# Patient Record
Sex: Female | Born: 1970 | Race: Black or African American | Hispanic: No | Marital: Married | Smoking: Former smoker
Health system: Southern US, Community
[De-identification: ages and names within clinical notes are randomized; demographics above are authoritative.]

## PROBLEM LIST (undated history)

## (undated) DIAGNOSIS — M549 Dorsalgia, unspecified: Secondary | ICD-10-CM

## (undated) DIAGNOSIS — K219 Gastro-esophageal reflux disease without esophagitis: Secondary | ICD-10-CM

## (undated) DIAGNOSIS — G4733 Obstructive sleep apnea (adult) (pediatric): Secondary | ICD-10-CM

## (undated) DIAGNOSIS — F419 Anxiety disorder, unspecified: Secondary | ICD-10-CM

## (undated) DIAGNOSIS — G932 Benign intracranial hypertension: Secondary | ICD-10-CM

## (undated) DIAGNOSIS — R5383 Other fatigue: Secondary | ICD-10-CM

## (undated) DIAGNOSIS — G5600 Carpal tunnel syndrome, unspecified upper limb: Secondary | ICD-10-CM

## (undated) DIAGNOSIS — G4710 Hypersomnia, unspecified: Secondary | ICD-10-CM

## (undated) DIAGNOSIS — E1365 Other specified diabetes mellitus with hyperglycemia: Secondary | ICD-10-CM

## (undated) DIAGNOSIS — E785 Hyperlipidemia, unspecified: Secondary | ICD-10-CM

## (undated) DIAGNOSIS — IMO0001 Reserved for inherently not codable concepts without codable children: Secondary | ICD-10-CM

## (undated) DIAGNOSIS — G43909 Migraine, unspecified, not intractable, without status migrainosus: Secondary | ICD-10-CM

## (undated) DIAGNOSIS — M542 Cervicalgia: Secondary | ICD-10-CM

## (undated) DIAGNOSIS — F329 Major depressive disorder, single episode, unspecified: Secondary | ICD-10-CM

## (undated) DIAGNOSIS — G500 Trigeminal neuralgia: Secondary | ICD-10-CM

## (undated) DIAGNOSIS — M797 Fibromyalgia: Secondary | ICD-10-CM

## (undated) DIAGNOSIS — R079 Chest pain, unspecified: Secondary | ICD-10-CM

## (undated) DIAGNOSIS — G8929 Other chronic pain: Secondary | ICD-10-CM

## (undated) DIAGNOSIS — I10 Essential (primary) hypertension: Secondary | ICD-10-CM

## (undated) DIAGNOSIS — R202 Paresthesia of skin: Secondary | ICD-10-CM

## (undated) HISTORY — DX: Hypersomnia, unspecified: G47.10

## (undated) HISTORY — DX: Dorsalgia, unspecified: M54.9

## (undated) HISTORY — DX: Reserved for inherently not codable concepts without codable children: IMO0001

## (undated) HISTORY — DX: Other fatigue: R53.83

## (undated) HISTORY — DX: Trigeminal neuralgia: G50.0

## (undated) HISTORY — DX: Fibromyalgia: M79.7

## (undated) HISTORY — PX: UPPER GI ENDOSCOPY: SHX6162

## (undated) HISTORY — DX: Migraine, unspecified, not intractable, without status migrainosus: G43.909

## (undated) HISTORY — DX: Morbid (severe) obesity due to excess calories: E66.01

## (undated) HISTORY — DX: Gastro-esophageal reflux disease without esophagitis: K21.9

## (undated) HISTORY — DX: Chest pain, unspecified: R07.9

## (undated) HISTORY — DX: Obstructive sleep apnea (adult) (pediatric): G47.33

## (undated) HISTORY — DX: Benign intracranial hypertension: G93.2

## (undated) HISTORY — PX: PARTIAL HYSTERECTOMY: SHX80

## (undated) HISTORY — DX: Hyperlipidemia, unspecified: E78.5

## (undated) HISTORY — DX: Paresthesia of skin: R20.2

## (undated) HISTORY — DX: Anxiety disorder, unspecified: F41.9

## (undated) HISTORY — DX: Carpal tunnel syndrome, unspecified upper limb: G56.00

## (undated) HISTORY — DX: Other chronic pain: G89.29

## (undated) HISTORY — DX: Other specified diabetes mellitus with hyperglycemia: E13.65

## (undated) HISTORY — DX: Cervicalgia: M54.2

## (undated) HISTORY — DX: Essential (primary) hypertension: I10

## (undated) HISTORY — DX: Major depressive disorder, single episode, unspecified: F32.9

---

## 2004-02-27 ENCOUNTER — Ambulatory Visit (HOSPITAL_COMMUNITY): Admission: RE | Admit: 2004-02-27 | Discharge: 2004-02-27 | Payer: Self-pay | Admitting: Internal Medicine

## 2004-02-27 ENCOUNTER — Ambulatory Visit: Payer: Self-pay | Admitting: Internal Medicine

## 2005-04-30 DIAGNOSIS — D649 Anemia, unspecified: Secondary | ICD-10-CM | POA: Insufficient documentation

## 2005-04-30 DIAGNOSIS — R5383 Other fatigue: Secondary | ICD-10-CM | POA: Insufficient documentation

## 2005-04-30 DIAGNOSIS — E876 Hypokalemia: Secondary | ICD-10-CM | POA: Insufficient documentation

## 2005-04-30 HISTORY — DX: Anemia, unspecified: D64.9

## 2005-04-30 HISTORY — DX: Hypokalemia: E87.6

## 2005-08-21 DIAGNOSIS — F411 Generalized anxiety disorder: Secondary | ICD-10-CM | POA: Insufficient documentation

## 2005-08-21 DIAGNOSIS — F329 Major depressive disorder, single episode, unspecified: Secondary | ICD-10-CM | POA: Insufficient documentation

## 2005-08-21 HISTORY — DX: Major depressive disorder, single episode, unspecified: F32.9

## 2005-08-21 HISTORY — DX: Generalized anxiety disorder: F41.1

## 2006-07-30 DIAGNOSIS — R928 Other abnormal and inconclusive findings on diagnostic imaging of breast: Secondary | ICD-10-CM

## 2006-07-30 HISTORY — DX: Other abnormal and inconclusive findings on diagnostic imaging of breast: R92.8

## 2006-12-17 DIAGNOSIS — S8000XA Contusion of unspecified knee, initial encounter: Secondary | ICD-10-CM

## 2006-12-17 HISTORY — DX: Contusion of unspecified knee, initial encounter: S80.00XA

## 2008-07-12 DIAGNOSIS — L909 Atrophic disorder of skin, unspecified: Secondary | ICD-10-CM

## 2008-07-12 HISTORY — DX: Atrophic disorder of skin, unspecified: L90.9

## 2009-05-20 ENCOUNTER — Ambulatory Visit: Payer: Self-pay | Admitting: Cardiology

## 2015-05-09 ENCOUNTER — Encounter: Payer: Self-pay | Admitting: *Deleted

## 2015-05-10 ENCOUNTER — Ambulatory Visit (INDEPENDENT_AMBULATORY_CARE_PROVIDER_SITE_OTHER): Payer: Managed Care, Other (non HMO) | Admitting: Neurology

## 2015-05-10 ENCOUNTER — Encounter: Payer: Self-pay | Admitting: Neurology

## 2015-05-10 VITALS — BP 130/84 | HR 76 | Ht 65.0 in | Wt 298.4 lb

## 2015-05-10 DIAGNOSIS — G43709 Chronic migraine without aura, not intractable, without status migrainosus: Secondary | ICD-10-CM | POA: Diagnosis not present

## 2015-05-10 DIAGNOSIS — G501 Atypical facial pain: Secondary | ICD-10-CM | POA: Diagnosis not present

## 2015-05-10 DIAGNOSIS — IMO0002 Reserved for concepts with insufficient information to code with codable children: Secondary | ICD-10-CM

## 2015-05-10 MED ORDER — CARBAMAZEPINE ER 100 MG PO TB12: 100.0000 mg | ORAL_TABLET | Freq: Two times a day (BID) | ORAL | Status: DC

## 2015-05-10 MED ORDER — ELETRIPTAN HYDROBROMIDE 20 MG PO TABS: 20.0000 mg | ORAL_TABLET | ORAL | Status: DC | PRN

## 2015-05-10 NOTE — Patient Instructions (Addendum)
1.  Increase carbamazepine to 100mg  twice daily 2.  If no improvement, start taking magnesium oxide 400-600mg  daily 3.  For severe headache, take Relpax 20mg  at headache onset.  Okay to repeat in 2 hours as needed  Return to clinic in 3 months

## 2015-05-10 NOTE — Progress Notes (Signed)
Note routed

## 2015-05-10 NOTE — Progress Notes (Signed)
[facility] Clinic Note - Initial Visit   Date: [id number]  JENNIGER LIGGON MRN: [id number] DOB: [birth year]   Dear Dr. Manuella Ghazi:  Thank you for your kind referral of Lenora Boys for consultation of facial pain. Although her history is well known to you, please allow [place] to reiterate it for the purpose of our medical record. The patient was accompanied to the clinic by husband who also provides collateral information.     History of Present Illness: Crystal Merritt is a 45 y.o. right-handed African [place] female with hypertension, migraines, fibromyalgia, diabetes mellitus, GERD, depression and anxiety presenting for evaluation of facial pain.    Since around ~2010, she began having pressure over the right cheek which was initially treated for sinusitis.  Symptoms would come and go, but for the past year, the pain become more constant.  Pain is always located on the over the right cheek, described as a tooth ache. She does not have electrical shock-like, stabbing, or shooting pain.    She also has long history of migraines since being a teenager.  She reports having daily headaches previously and lasting all day.  Triggers include stress, bright lights, and smell of smoke.  She has tried imitrex and topiramate without any relief.  She was started on carbmazepine 100mg  daily two weeks ago and has noticed 25% improvement.  Since taking carbmazpeine, headaches now occur about twice per week and are less intense.  Her facial pain has also improved.  Out-side paper records, electronic medical record, and images have been reviewed where available and summarized as:  Labs 02/01/2015:  HbA1c 6.9 Labs 09/22/2014:  TSH 2.030, vitamin B12 589  Past Medical History  Diagnosis Date  . Hypertension   . Hypersomnia   . Migraine   . Trigeminal neuralgia   . Fibromyalgia   . Morbid obesity ([place])   . Backache   . Depressive disorder   . GERD (gastroesophageal reflux  disease)   . Chronic neck pain   . Paresthesia of arm   . Chest pain   . MODY 1, uncomplicated, uncontrolled ([place])   . CTS (carpal tunnel syndrome)   . Fatigue   . Anxiety   . Hyperlipidemia     Past Surgical History  Procedure Laterality Date  . Partial hysterectomy       Medications:  Outpatient Encounter Prescriptions as of 05/10/2015  Medication Sig  . amLODipine (NORVASC) 10 MG tablet Take 10 mg by mouth daily.  Marland Kitchen aspirin 81 MG tablet Take 81 mg by mouth daily.  Marland Kitchen atorvastatin (LIPITOR) 40 MG tablet Take 40 mg by mouth daily.  . Biotin 5000 MCG CAPS Take by mouth.  . carbamazepine (TEGRETOL XR) 100 MG 12 hr tablet Take 1 tablet (100 mg total) by mouth 2 (two) times daily.  . clonazePAM (KLONOPIN) 0.5 MG tablet Take 0.5 mg by mouth 2 (two) times daily as needed for anxiety.  . DULoxetine (CYMBALTA) 60 MG capsule Take 60 mg by mouth daily.  Marland Kitchen losartan-hydrochlorothiazide (HYZAAR) 100-25 MG tablet Take 1 tablet by mouth daily.  . metFORMIN (GLUCOPHAGE) 500 MG tablet Take by mouth 2 (two) times daily with a meal. 2 in the am and 1 in the evening  . methocarbamol (ROBAXIN) 500 MG tablet Take 500 mg by mouth 4 (four) times daily.  . ondansetron (ZOFRAN) 8 MG tablet Take by mouth every 8 (eight) hours as needed for nausea or vomiting.  . pantoprazole (PROTONIX) 40 MG tablet Take 40  mg by mouth daily.  . pioglitazone (ACTOS) 30 MG tablet Take 30 mg by mouth daily.  . pregabalin (LYRICA) 150 MG capsule Take 150 mg by mouth 2 (two) times daily.  . promethazine (PHENERGAN) 25 MG tablet Take 25 mg by mouth every 6 (six) hours as needed for nausea or vomiting.  . [DISCONTINUED] carbamazepine (TEGRETOL XR) 100 MG 12 hr tablet Take 100 mg by mouth 2 (two) times daily.  Marland Kitchen eletriptan (RELPAX) 20 MG tablet Take 1 tablet (20 mg total) by mouth as needed for migraine or headache. May repeat in 2 hours if headache persists or recurs.  . [DISCONTINUED] topiramate (TOPAMAX) 100 MG tablet Take 100  mg by mouth 2 (two) times daily.   No facility-administered encounter medications on file as of 05/10/2015.     Allergies: No Known Allergies  Family History: Family History  Problem Relation Age of Onset  . Hypertension Mother     Deceased  . Heart disease Brother   . Diabetes Mother   . Diabetes Father     Deceased  . Diabetes Brother   . Kidney failure Brother     Deceased    Social History: Social History  Substance Use Topics  . Smoking status: Former Research scientist (life sciences)  . Smokeless tobacco: Never Used  . Alcohol Use: 0.0 oz/week    0 Standard drinks or equivalent per week     Comment: Rare   Social History   Social History Narrative   Lives with husband in a 2 story home.  Has 2 children.     Works in a call center Development worker, international aid)    Education: 2 year college degree.    Review of Systems:  CONSTITUTIONAL: No fevers, chills, night sweats, or weight loss.   EYES: No visual changes or eye pain ENT: No hearing changes.  No history of nose bleeds.   RESPIRATORY: No cough, wheezing and shortness of breath.   CARDIOVASCULAR: Negative for chest pain, and palpitations.   GI: Negative for abdominal discomfort, blood in stools or black stools.  No recent change in bowel habits.   GU:  No history of incontinence.   MUSCLOSKELETAL: No history of joint pain or swelling.  No myalgias.   SKIN: Negative for lesions, rash, and itching.   HEMATOLOGY/ONCOLOGY: Negative for prolonged bleeding, bruising easily, and swollen nodes.  No history of cancer.   ENDOCRINE: Negative for cold or heat intolerance, polydipsia or goiter.   PSYCH:  +depression or anxiety symptoms.   NEURO: As Above.   Vital Signs:  BP 130/84 mmHg  Pulse 76  Ht 5\' 5"  (1.651 m)  Wt 298 lb 6 oz (135.342 kg)  BMI 49.65 kg/m2  SpO2 98%   General Medical Exam:   General:  Well appearing, comfortable, morbidly obese.   Eyes/ENT: see cranial nerve examination.   Neck: No masses appreciated.  Full range of motion  without tenderness.  No carotid bruits. Respiratory:  Clear to auscultation, good air entry bilaterally.   Cardiac:  Regular rate and rhythm, no murmur.   Extremities:  No deformities, edema, or skin discoloration.  Skin:  No rashes or lesions.  Neurological Exam: MENTAL STATUS including orientation to time, place, person, recent and remote memory, attention span and concentration, language, and fund of knowledge is normal.  Speech is not dysarthric.  CRANIAL NERVES: II:  No visual field defects.  Unremarkable fundi.   III-IV-VI: Pupils equal round and reactive to light.  Normal conjugate, extra-ocular eye movements in all directions  of gaze.  No nystagmus.  No ptosis.   V:  Normal facial sensation.     VII:  Normal facial symmetry and movements.  VIII:  Normal hearing and vestibular function.   IX-X:  Normal palatal movement.   XI:  Normal shoulder shrug and head rotation.   XII:  Normal tongue strength and range of motion, no deviation or fasciculation.  MOTOR:  No atrophy, fasciculations or abnormal movements.  No pronator drift.  Tone is normal.    Right Upper Extremity:    Left Upper Extremity:    Deltoid  5/5   Deltoid  5/5   Biceps  5/5   Biceps  5/5   Triceps  5/5   Triceps  5/5   Wrist extensors  5/5   Wrist extensors  5/5   Wrist flexors  5/5   Wrist flexors  5/5   Finger extensors  5/5   Finger extensors  5/5   Finger flexors  5/5   Finger flexors  5/5   Dorsal interossei  5/5   Dorsal interossei  5/5   Abductor pollicis  5/5   Abductor pollicis  5/5   Tone (Ashworth scale)  0  Tone (Ashworth scale)  0   Right Lower Extremity:    Left Lower Extremity:    Hip flexors  5/5   Hip flexors  5/5   Hip extensors  5/5   Hip extensors  5/5   Knee flexors  5/5   Knee flexors  5/5   Knee extensors  5/5   Knee extensors  5/5   Dorsiflexors  5/5   Dorsiflexors  5/5   Plantarflexors  5/5   Plantarflexors  5/5   Toe extensors  5/5   Toe extensors  5/5   Toe flexors  5/5   Toe  flexors  5/5   Tone (Ashworth scale)  0  Tone (Ashworth scale)  0   MSRs:  Right                                                                 Left brachioradialis 2+  brachioradialis 2+  biceps 2+  biceps 2+  triceps 2+  triceps 2+  patellar 2+  patellar 2+  ankle jerk 2+  ankle jerk 2+  Hoffman no  Hoffman no  plantar response down  plantar response down   SENSORY:  Normal and symmetric perception of light touch, pinprick, vibration, and proprioception.  Romberg's sign absent.   COORDINATION/GAIT: Normal finger-to- nose-finger and heel-to-shin.  Intact rapid alternating movements bilaterally.  Able to rise from a chair without using arms.  Gait narrow based and stable.     IMPRESSION: 1.  Atypical facial pain  - History and exam are not suggestive of trigeminal neuralgia, but fortunately she is having favorable response to carbamazepine, so I will further optimize this to 100mg  twice daily.  Side effects discussed.  She was informed to stop the medication if there is any rash.  2.  Chronic migraine  - Previously tried topiramate and imitrex  - She is already taking Lyrica and Cymbalta for fibromyalgia  - Improved on carbamazepine 100mg , hopefully will be able to reduce headaches even more with optimizing medication to twice daily  - She may try magnesium  oxide 400-600mg  daily also   - For acute treatment, take relpax 20mg  at headache onset  - Limit all rescue medications to twice per week  Return to clinic in 3 months.   The duration of this appointment visit was 45 minutes of face-to-face time with the patient.  Greater than 50% of this time was spent in counseling, explanation of diagnosis, planning of further management, and coordination of care.   Thank you for allowing me to participate in patient's care.  If I can answer any additional questions, I would be pleased to do so.    Sincerely,    Josephmichael Lisenbee K. Posey Pronto, DO

## 2015-08-22 ENCOUNTER — Ambulatory Visit (INDEPENDENT_AMBULATORY_CARE_PROVIDER_SITE_OTHER): Payer: Managed Care, Other (non HMO) | Admitting: Neurology

## 2015-08-22 ENCOUNTER — Ambulatory Visit: Payer: Managed Care, Other (non HMO) | Admitting: Neurology

## 2015-08-22 ENCOUNTER — Encounter: Payer: Self-pay | Admitting: Neurology

## 2015-08-22 VITALS — BP 100/80 | HR 72 | Ht 65.0 in | Wt 288.1 lb

## 2015-08-22 DIAGNOSIS — G501 Atypical facial pain: Secondary | ICD-10-CM

## 2015-08-22 DIAGNOSIS — G43909 Migraine, unspecified, not intractable, without status migrainosus: Secondary | ICD-10-CM

## 2015-08-22 DIAGNOSIS — G43709 Chronic migraine without aura, not intractable, without status migrainosus: Secondary | ICD-10-CM | POA: Diagnosis not present

## 2015-08-22 DIAGNOSIS — IMO0002 Reserved for concepts with insufficient information to code with codable children: Secondary | ICD-10-CM

## 2015-08-22 HISTORY — DX: Atypical facial pain: G50.1

## 2015-08-22 HISTORY — DX: Migraine, unspecified, not intractable, without status migrainosus: G43.909

## 2015-08-22 NOTE — Patient Instructions (Signed)
Great work on your [employer] loss!  Keep it up Start a walking exercise program  Return to clinic in 9 month

## 2015-08-22 NOTE — Progress Notes (Signed)
Follow-up Visit   Date: [id number]    Crystal Merritt MRN: [id number] DOB: [birth year]   Interim History: Crystal Merritt is a 45 y.o. 45 y.o. right-handed African [place] female with hypertension, migraines, fibromyalgia, diabetes mellitus, GERD, depression and anxiety  returning to the clinic for follow-up of atypical right facial pain.   History of present illness: Since around ~2010, she began having pressure over the right cheek which was initially treated for sinusitis.  Symptoms would come and go, but for the past year, the pain become more constant.  Pain is always located on the over the right cheek, described as a tooth ache. She does not have electrical shock-like, stabbing, or shooting pain.    She also has long history of migraines since being a teenager.  She reports having daily headaches previously and lasting all day. Triggers include stress, bright lights, and smell of smoke.  She has tried imitrex and topiramate without any relief.  She was started on carbmazepine 100mg  daily two weeks ago and has noticed 25% improvement.  Since taking carbmazpeine, headaches now occur about twice per week and are less intense.  Her facial pain has also improved.  UPDATE 08/22/2015:  She reports having resolution of facial pain with increasing her carbamazepine to 100mg  twice daily. She is tolerating the medication well.  Her headaches are also markedly improved, now occurring only per month and respond to Excedrin.   She is actively trying to loose weight and has lost 13lb since her last visit by making better diet choices.   Medications:  Current Outpatient Prescriptions on File Prior to Visit  Medication Sig Dispense Refill  . amLODipine (NORVASC) 10 MG tablet Take 5 mg by mouth daily.     Marland Kitchen aspirin 81 MG tablet Take 81 mg by mouth daily.    Marland Kitchen atorvastatin (LIPITOR) 40 MG tablet Take 40 mg by mouth daily.    . Biotin 5000 MCG CAPS Take by mouth.    . carbamazepine (TEGRETOL XR)  100 MG 12 hr tablet Take 1 tablet (100 mg total) by mouth 2 (two) times daily. 180 tablet 3  . clonazePAM (KLONOPIN) 0.5 MG tablet Take 0.5 mg by mouth 2 (two) times daily as needed for anxiety.    . DULoxetine (CYMBALTA) 60 MG capsule Take 60 mg by mouth daily.    Marland Kitchen eletriptan (RELPAX) 20 MG tablet Take 1 tablet (20 mg total) by mouth as needed for migraine or headache. May repeat in 2 hours if headache persists or recurs. 10 tablet 2  . losartan-hydrochlorothiazide (HYZAAR) 100-25 MG tablet Take 1 tablet by mouth daily.    . metFORMIN (GLUCOPHAGE) 500 MG tablet Take by mouth 2 (two) times daily with a meal. 2 in the am and 1 in the evening    . methocarbamol (ROBAXIN) 500 MG tablet Take 500 mg by mouth 4 (four) times daily.    . ondansetron (ZOFRAN) 8 MG tablet Take by mouth every 8 (eight) hours as needed for nausea or vomiting.    . pantoprazole (PROTONIX) 40 MG tablet Take 40 mg by mouth daily.    . pregabalin (LYRICA) 150 MG capsule Take 150 mg by mouth 2 (two) times daily.    . promethazine (PHENERGAN) 25 MG tablet Take 25 mg by mouth every 6 (six) hours as needed for nausea or vomiting.     No current facility-administered medications on file prior to visit.     Allergies: No Known Allergies  Review of Systems:  CONSTITUTIONAL: No fevers, chills, night sweats, or weight loss.  EYES: No visual changes or eye pain ENT: No hearing changes.  No history of nose bleeds.   RESPIRATORY: No cough, wheezing and shortness of breath.   CARDIOVASCULAR: Negative for chest pain, and palpitations.   GI: Negative for abdominal discomfort, blood in stools or black stools.  No recent change in bowel habits.   GU:  No history of incontinence.   MUSCLOSKELETAL: No history of joint pain or swelling.  No myalgias.   SKIN: Negative for lesions, rash, and itching.   ENDOCRINE: Negative for cold or heat intolerance, polydipsia or goiter.   PSYCH:  No depression or anxiety symptoms.   NEURO: As  Above.   Vital Signs:  BP 100/80   Pulse 72   Ht 5\' 5"  (1.651 m)   Wt 288 lb 2 oz (130.7 kg)   SpO2 94%   BMI 47.95 kg/m   Neurological Exam: MENTAL STATUS including orientation to time, place, person, recent and remote memory, attention span and concentration, language, and fund of knowledge is normal.  Speech is not dysarthric.  CRANIAL NERVES:  Pupils equal round and reactive to light.  Normal conjugate, extra-ocular eye movements in all directions of gaze.  No ptosis. Normal facial sensation.  Face is symmetric. Palate elevates symmetrically.  Tongue is midline.  MOTOR:  Motor strength is 5/5 in all extremities.    COORDINATION/GAIT:   Gait narrow based and stable.   Data: Labs 02/01/2015:  HbA1c 6.9 Labs 09/22/2014:  TSH 2.030, vitamin B12 589  IMPRESSION/PLAN: 1.  Atypical facial pain - resolved with carbamazepine 100mg  twice daily  2.  Chronic migraine, improved!  Now only having headaches once per month which responsive to Excedrin  - Previously tried topiramate and imitrex and already taking Lyrica and Cymbalta for fibromyalgia  - Continue carbamazepine 100mg  BID, which has markedly improved headache frequency and intensity  3.  Morbid obesity  - Praised for loosing 13lb with diet changes  - Encouraged her to start exercise program with at least 10-min walking daily and gradually build up   Return to clinic in 9 months  The duration of this appointment visit was 25 minutes of face-to-face time with the patient.  Greater than 50% of this time was spent in counseling, explanation of diagnosis, planning of further management, and coordination of care.   Thank you for allowing me to participate in patient's care.  If I can answer any additional questions, I would be pleased to do so.    Sincerely,    Teniqua Marron K. Posey Pronto, DO

## 2016-03-13 ENCOUNTER — Encounter: Payer: Self-pay | Admitting: Neurology

## 2016-03-13 ENCOUNTER — Ambulatory Visit (INDEPENDENT_AMBULATORY_CARE_PROVIDER_SITE_OTHER): Payer: Managed Care, Other (non HMO) | Admitting: Neurology

## 2016-03-13 ENCOUNTER — Other Ambulatory Visit (INDEPENDENT_AMBULATORY_CARE_PROVIDER_SITE_OTHER): Payer: Managed Care, Other (non HMO)

## 2016-03-13 VITALS — BP 120/80 | HR 76 | Ht 65.0 in | Wt 262.2 lb

## 2016-03-13 DIAGNOSIS — IMO0002 Reserved for concepts with insufficient information to code with codable children: Secondary | ICD-10-CM

## 2016-03-13 DIAGNOSIS — R51 Headache: Secondary | ICD-10-CM | POA: Diagnosis not present

## 2016-03-13 DIAGNOSIS — G43709 Chronic migraine without aura, not intractable, without status migrainosus: Secondary | ICD-10-CM | POA: Diagnosis not present

## 2016-03-13 DIAGNOSIS — R200 Anesthesia of skin: Secondary | ICD-10-CM | POA: Diagnosis not present

## 2016-03-13 DIAGNOSIS — R519 Headache, unspecified: Secondary | ICD-10-CM

## 2016-03-13 DIAGNOSIS — R202 Paresthesia of skin: Secondary | ICD-10-CM

## 2016-03-13 LAB — TSH: TSH: 2.29 u[IU]/mL (ref 0.35–4.50)

## 2016-03-13 LAB — BASIC METABOLIC PANEL
BUN: 12 mg/dL (ref 6–23)
CHLORIDE: 99 meq/L (ref 96–112)
CO2: 33 meq/L — AB (ref 19–32)
CREATININE: 0.75 mg/dL (ref 0.40–1.20)
Calcium: 9 mg/dL (ref 8.4–10.5)
GFR: 107.11 mL/min (ref >60.00)
Glucose, Bld: 138 mg/dL — ABNORMAL HIGH (ref 70–99)
POTASSIUM: 3 meq/L — AB (ref 3.5–5.1)
Sodium: 139 mEq/L (ref 135–145)

## 2016-03-13 LAB — VITAMIN B12: VITAMIN B 12: 404 pg/mL (ref 211–911)

## 2016-03-13 MED ORDER — CARBAMAZEPINE ER 100 MG PO TB12: 100.0000 mg | ORAL_TABLET | Freq: Two times a day (BID) | ORAL | 3 refills | Status: DC

## 2016-03-13 NOTE — Patient Instructions (Addendum)
1.  MRI/A brain wwo contrast 2.  Increase carbamazepine to 100mg  twice daily 3.  Check labs  Return to clinic in 6 months

## 2016-03-13 NOTE — Progress Notes (Signed)
Follow-up Visit   Date: [id number]    Crystal Merritt MRN: [id number] DOB: Feb 26, 1970   Interim History: Crystal Merritt is a 46 y.o. 46 y.o. right-handed African [place] female with hypertension, migraines, fibromyalgia, diabetes mellitus, GERD, depression and anxiety returning to the clinic for follow-up of headaches.    History of present illness: Since around ~2010, she began having pressure over the right cheek which was initially treated for sinusitis.  Symptoms would come and go, but for the past year, the pain become more constant.  Pain is always located on the over the right cheek, described as a tooth ache. She does not have electrical shock-like, stabbing, or shooting pain.    She also has long history of migraines since being a teenager.  She reports having daily headaches previously and lasting all day. Triggers include stress, bright lights, and smell of smoke.  She has tried imitrex and topiramate without any relief.  She was started on carbmazepine 100mg  daily two weeks ago and has noticed 25% improvement.  Since taking carbmazpeine, headaches now occur about twice per week and are less intense.  Her facial pain has also improved.  UPDATE 08/22/2015:  She reports having resolution of facial pain with increasing her carbamazepine to 100mg  twice daily. She is tolerating the medication well.  Her headaches are also markedly improved, now occurring only per month and respond to Excedrin.   She is actively trying to loose weight and has lost 13lb since her last visit by making better diet choices.   UPDATE 03/13/2016:  She self tapered her carbamazepine to 100mg  daily soon after her last visit because her pain was doing well.  However, starting around late 2017, she started having worsening nagging pain over the right side of her face and her headaches returned.  Pain is sharp in her face and stabbing the head.  Pain lasts all day.  She has nausea, photophobia, and phonophobia.   She also feels that she is drooling on the right side of her mouth.  She has tried excedrin migraine, which helps for a few hours only.  She denies having any adverse effects to carbamazepine.  Medications:  Current Outpatient Prescriptions on File Prior to Visit  Medication Sig Dispense Refill  . amLODipine (NORVASC) 10 MG tablet Take 5 mg by mouth daily.     Marland Kitchen aspirin 81 MG tablet Take 81 mg by mouth daily.    Marland Kitchen atorvastatin (LIPITOR) 40 MG tablet Take 40 mg by mouth daily.    . Biotin 5000 MCG CAPS Take by mouth.    . canagliflozin (INVOKANA) 100 MG TABS tablet Take 100 mg by mouth daily before breakfast.    . carbamazepine (TEGRETOL XR) 100 MG 12 hr tablet Take 1 tablet (100 mg total) by mouth 2 (two) times daily. 180 tablet 3  . clonazePAM (KLONOPIN) 0.5 MG tablet Take 0.5 mg by mouth 2 (two) times daily as needed for anxiety.    . DULoxetine (CYMBALTA) 60 MG capsule Take 60 mg by mouth daily.    Marland Kitchen eletriptan (RELPAX) 20 MG tablet Take 1 tablet (20 mg total) by mouth as needed for migraine or headache. May repeat in 2 hours if headache persists or recurs. 10 tablet 2  . losartan-hydrochlorothiazide (HYZAAR) 100-25 MG tablet Take 1 tablet by mouth daily.    . metFORMIN (GLUCOPHAGE) 500 MG tablet Take by mouth 2 (two) times daily with a meal. 2 in the am and 1 in the evening    .  methocarbamol (ROBAXIN) 500 MG tablet Take 500 mg by mouth 4 (four) times daily.    . ondansetron (ZOFRAN) 8 MG tablet Take by mouth every 8 (eight) hours as needed for nausea or vomiting.    . pantoprazole (PROTONIX) 40 MG tablet Take 40 mg by mouth daily.    . pregabalin (LYRICA) 150 MG capsule Take 150 mg by mouth 2 (two) times daily.    . promethazine (PHENERGAN) 25 MG tablet Take 25 mg by mouth every 6 (six) hours as needed for nausea or vomiting.     No current facility-administered medications on file prior to visit.     Allergies: No Known Allergies  Review of Systems:  CONSTITUTIONAL: No fevers,  chills, night sweats, or weight loss.  EYES: No visual changes or eye pain ENT: No hearing changes.  No history of nose bleeds.   RESPIRATORY: No cough, wheezing and shortness of breath.   CARDIOVASCULAR: Negative for chest pain, and palpitations.   GI: Negative for abdominal discomfort, blood in stools or black stools.  No recent change in bowel habits.   GU:  No history of incontinence.   MUSCLOSKELETAL: No history of joint pain or swelling.  No myalgias.   SKIN: Negative for lesions, rash, and itching.   ENDOCRINE: Negative for cold or heat intolerance, polydipsia or goiter.   PSYCH:  No depression or anxiety symptoms.   NEURO: As Above.   Vital Signs:  BP 120/80   Pulse 76   Ht 5\' 5"  (1.651 m)   Wt 262 lb 4 oz (119 kg)   SpO2 98%   BMI 43.64 kg/m   General Medical Exam:   General:  Well appearing, comfortable, morbidly obese.   Eyes/ENT: see cranial nerve examination.   Neck: No masses appreciated.  Full range of motion without tenderness.  No carotid bruits. Respiratory:  Clear to auscultation, good air entry bilaterally.   Cardiac:  Regular rate and rhythm, no murmur.    Neurological Exam: MENTAL STATUS including orientation to time, place, person, recent and remote memory, attention span and concentration, language, and fund of knowledge is normal.  Speech is not dysarthric.  CRANIAL NERVES:  Normal fundoscopic exam. Pupils equal round and reactive to light.  Normal conjugate, extra-ocular eye movements in all directions of gaze.  No ptosis. Normal facial sensation.  Face is symmetric. Palate elevates symmetrically.  Tongue is midline.  MOTOR:  Motor strength is 5/5 in all extremities.  Normal tone. No drift.  SENSORY:  Vibration intact throughout.  Temperature and pin prick is reduced over the left arm.  REFLEXES:  Normal 2+/4 throughout.  Flexor plantar responses.   COORDINATION/GAIT:  Finger to nose is normal.  Gait narrow based and stable.   Data: Labs  02/01/2015:  HbA1c 6.9 Labs 09/22/2014:  TSH 2.030, vitamin B12 589  IMPRESSION/PLAN: 1.  Atypical facial pain/oaresthesias, worsening 2.  Chronic migraine, worsening.   Previously tried topiramate, imitrex and already taking Lyrica and Cymbalta for fibromyalgia  She self-tapered to carbmazepine 100mg  daily and was doing much better on 100mg  BID, so will restart her on this.  Because of her lateralizing sensory changes, MRI brain wwo contrast and MRA head will be ordered.  She has not had any prior imaging.  Check BMP, TSH, vitamin B12 3.  Morbid obesity  Praised for loosing weight with diet changes  Encouraged her to start exercise program   Return to clinic in 6 months  The duration of this appointment visit was 30 minutes  of face-to-face time with the patient.  Greater than 50% of this time was spent in counseling, explanation of diagnosis, planning of further management, and coordination of care.   Thank you for allowing me to participate in patient's care.  If I can answer any additional questions, I would be pleased to do so.    Sincerely,    Donika K. Posey Pronto, DO

## 2016-03-16 ENCOUNTER — Telehealth: Payer: Self-pay | Admitting: *Deleted

## 2016-03-16 NOTE — Telephone Encounter (Signed)
Left message for patient to call me back. 

## 2016-03-16 NOTE — Progress Notes (Signed)
Left message for patient to call me back. 

## 2016-03-17 ENCOUNTER — Telehealth: Payer: Self-pay | Admitting: *Deleted

## 2016-03-17 NOTE — Telephone Encounter (Signed)
-----   Message from Crystal Berthold, DO sent at 03/16/2016  9:29 AM EST ----- Please notify patient lab are within normal limits, except her potassium is low - recommending eating potassium rich foods such as bananas, sweet potatoes, and orange juice.  Thank you.

## 2016-03-17 NOTE — Telephone Encounter (Signed)
Left message giving patient results and instructions.   

## 2016-03-23 ENCOUNTER — Telehealth: Payer: Self-pay | Admitting: Neurology

## 2016-03-23 NOTE — Telephone Encounter (Signed)
PT left a message that she has not heard from [facility] CB# [phone]

## 2016-03-24 NOTE — Telephone Encounter (Signed)
I called [facility] and they said that they will contact patient.  They had tried but the other phone had been disconnected.  I gave them her new number.

## 2016-04-15 ENCOUNTER — Telehealth: Payer: Self-pay | Admitting: Neurology

## 2016-04-15 NOTE — Telephone Encounter (Signed)
PT called and said [facility] needs more info from Dr Posey Pronto before they set up her MRI, wants to know medical necessity for ins. purpose/Dawn

## 2016-04-15 NOTE — Telephone Encounter (Signed)
I spoke with patient and she said that her insurance company needs more information to make sure tests are medically necessary.

## 2016-05-05 ENCOUNTER — Telehealth: Payer: Self-pay | Admitting: Neurology

## 2016-05-05 NOTE — Telephone Encounter (Signed)
Caller: PT  Urgent? No  Reason for the call: VM-PT left a message saying she has not heard anything regarding her MRI/Dawn

## 2016-05-05 NOTE — Telephone Encounter (Signed)
Called [facility] and they said that they have been unable to get in touch with patient.  Called patient back and left message letting her know what is going on and gave her their number to call and schedule the appointment.

## 2016-05-19 ENCOUNTER — Ambulatory Visit
Admission: RE | Admit: 2016-05-19 | Discharge: 2016-05-19 | Disposition: A | Discharge status: Home / Self Care | Payer: Managed Care, Other (non HMO) | Source: Ambulatory Visit | Attending: Neurology | Admitting: Neurology

## 2016-05-19 DIAGNOSIS — IMO0002 Reserved for concepts with insufficient information to code with codable children: Secondary | ICD-10-CM

## 2016-05-19 DIAGNOSIS — G43709 Chronic migraine without aura, not intractable, without status migrainosus: Secondary | ICD-10-CM

## 2016-05-19 DIAGNOSIS — R51 Headache: Secondary | ICD-10-CM

## 2016-05-19 DIAGNOSIS — R519 Headache, unspecified: Secondary | ICD-10-CM

## 2016-05-19 DIAGNOSIS — R200 Anesthesia of skin: Secondary | ICD-10-CM

## 2016-05-19 DIAGNOSIS — R202 Paresthesia of skin: Secondary | ICD-10-CM

## 2016-05-19 MED ORDER — GADOBENATE DIMEGLUMINE 529 MG/ML IV SOLN
20.0000 mL | Freq: Once | INTRAVENOUS | Status: AC | PRN
  Administered 2016-05-19: 20 mL via INTRAVENOUS

## 2016-05-21 ENCOUNTER — Telehealth: Payer: Self-pay | Admitting: Neurology

## 2016-05-21 NOTE — Telephone Encounter (Signed)
Called and discussed results of MRI brain which shows:  Prominent optic nerve sheaths, possible flattening of the optic papillae, could represent a manifestation of idiopathic intracranial hypertension. Correlate clinically as a cause of headaches. Lumbar puncture could be helpful in further evaluation.  Patient reports having retrorbital pain and vision changes.  To evaluate for intracranial hypertension, she will have CSF testing to assess opening pressure.  She will also schedule an appointment to see her eye doctor for evaluation.  Donika K. Posey Pronto, DO

## 2016-05-22 ENCOUNTER — Other Ambulatory Visit: Payer: Self-pay | Admitting: *Deleted

## 2016-05-22 ENCOUNTER — Ambulatory Visit: Payer: Managed Care, Other (non HMO) | Admitting: Neurology

## 2016-05-22 DIAGNOSIS — R202 Paresthesia of skin: Secondary | ICD-10-CM

## 2016-05-22 DIAGNOSIS — R200 Anesthesia of skin: Secondary | ICD-10-CM

## 2016-05-22 DIAGNOSIS — R519 Headache, unspecified: Secondary | ICD-10-CM

## 2016-05-22 DIAGNOSIS — IMO0002 Reserved for concepts with insufficient information to code with codable children: Secondary | ICD-10-CM

## 2016-05-22 DIAGNOSIS — G501 Atypical facial pain: Secondary | ICD-10-CM

## 2016-05-22 DIAGNOSIS — R51 Headache: Secondary | ICD-10-CM

## 2016-05-22 DIAGNOSIS — R9389 Abnormal findings on diagnostic imaging of other specified body structures: Secondary | ICD-10-CM

## 2016-05-22 DIAGNOSIS — G43709 Chronic migraine without aura, not intractable, without status migrainosus: Secondary | ICD-10-CM

## 2016-05-22 NOTE — Telephone Encounter (Signed)
Order placed for LP.  Left message notifying Angelita Ingles.  She will call and schedule patient.

## 2016-05-28 ENCOUNTER — Other Ambulatory Visit: Payer: Self-pay | Admitting: Neurology

## 2016-05-28 ENCOUNTER — Ambulatory Visit
Admission: RE | Admit: 2016-05-28 | Discharge: 2016-05-28 | Disposition: A | Discharge status: Home / Self Care | Payer: Managed Care, Other (non HMO) | Source: Ambulatory Visit | Attending: Neurology | Admitting: Neurology

## 2016-05-28 DIAGNOSIS — G501 Atypical facial pain: Secondary | ICD-10-CM

## 2016-05-28 DIAGNOSIS — R202 Paresthesia of skin: Secondary | ICD-10-CM

## 2016-05-28 DIAGNOSIS — R200 Anesthesia of skin: Secondary | ICD-10-CM

## 2016-05-28 DIAGNOSIS — R9389 Abnormal findings on diagnostic imaging of other specified body structures: Secondary | ICD-10-CM

## 2016-05-28 DIAGNOSIS — R519 Headache, unspecified: Secondary | ICD-10-CM

## 2016-05-28 DIAGNOSIS — R51 Headache: Secondary | ICD-10-CM

## 2016-05-28 DIAGNOSIS — G43709 Chronic migraine without aura, not intractable, without status migrainosus: Secondary | ICD-10-CM

## 2016-05-28 DIAGNOSIS — IMO0002 Reserved for concepts with insufficient information to code with codable children: Secondary | ICD-10-CM

## 2016-05-28 LAB — CSF CELL COUNT WITH DIFFERENTIAL
RBC COUNT CSF: 37 {cells}/uL — AB (ref 0–10)
WBC CSF: 1 {cells}/uL (ref 0–5)

## 2016-05-28 LAB — PROTEIN, CSF: Total Protein, CSF: 24 mg/dL (ref 15–45)

## 2016-05-28 LAB — GLUCOSE, CSF: Glucose, CSF: 114 mg/dL — ABNORMAL HIGH (ref 43–76)

## 2016-05-28 NOTE — Discharge Instructions (Signed)

## 2016-05-29 ENCOUNTER — Telehealth: Payer: Self-pay | Admitting: Neurology

## 2016-05-29 MED ORDER — ACETAZOLAMIDE 250 MG PO TABS: 250.0000 mg | ORAL_TABLET | Freq: Two times a day (BID) | ORAL | 5 refills | Status: DC

## 2016-05-29 NOTE — Telephone Encounter (Signed)
Called and discussed results of CSF testing which showed mildly elevated OP of 21cm H20.  CSF glucose is elevated and she reports being off one of her diabetic medication for the past 3 months due to cost.  She is scheduled to have her eye exam today.  Although her pressures are only mildly elevated, she continues to have retroorbital pain so will start her on diamox 250mg  twice daily.  Risks and benefits discussed.  Donika K. Posey Pronto, DO

## 2016-06-25 ENCOUNTER — Telehealth: Payer: Self-pay | Admitting: Neurology

## 2016-06-25 NOTE — Telephone Encounter (Signed)
Office note received from Dr. Mitzi Hansen with [facility] dated 06/17/2016. 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  Left eye is normal. Visual field testing was normal.

## 2016-08-15 ENCOUNTER — Other Ambulatory Visit: Payer: Self-pay | Admitting: Neurology

## 2016-08-17 NOTE — Telephone Encounter (Signed)
I don't see where you have ordered this.  Please advise.

## 2016-08-18 ENCOUNTER — Telehealth: Payer: Self-pay | Admitting: Neurology

## 2016-08-18 ENCOUNTER — Other Ambulatory Visit: Payer: Self-pay | Admitting: *Deleted

## 2016-08-18 MED ORDER — ACETAZOLAMIDE 250 MG PO TABS: 250.0000 mg | ORAL_TABLET | Freq: Two times a day (BID) | ORAL | 1 refills | Status: DC

## 2016-08-18 NOTE — Telephone Encounter (Signed)
Patient called needing to check on the status of her Refill for Diamox 90 day supply. She said her Insurance would only pay for the 90 day supply. She said her pharmacy sent over the request. Please Advise. Thanks

## 2016-08-18 NOTE — Telephone Encounter (Signed)
Patient notified new Rx sent in.  °

## 2016-09-18 ENCOUNTER — Encounter: Payer: Self-pay | Admitting: Neurology

## 2016-09-18 ENCOUNTER — Ambulatory Visit (INDEPENDENT_AMBULATORY_CARE_PROVIDER_SITE_OTHER): Payer: Managed Care, Other (non HMO) | Admitting: Neurology

## 2016-09-18 VITALS — BP 130/90 | HR 64 | Ht 65.0 in | Wt 265.4 lb

## 2016-09-18 DIAGNOSIS — G932 Benign intracranial hypertension: Secondary | ICD-10-CM

## 2016-09-18 DIAGNOSIS — G43709 Chronic migraine without aura, not intractable, without status migrainosus: Secondary | ICD-10-CM

## 2016-09-18 DIAGNOSIS — IMO0002 Reserved for concepts with insufficient information to code with codable children: Secondary | ICD-10-CM

## 2016-09-18 MED ORDER — ACETAZOLAMIDE 250 MG PO TABS: 500.0000 mg | ORAL_TABLET | Freq: Two times a day (BID) | ORAL | 3 refills | Status: DC

## 2016-09-18 NOTE — Progress Notes (Signed)
Follow-up Visit   Date: 02/40/97    BRIGITTA PRICER MRN: [id number] DOB: 1970-04-28   Interim History: Crystal Merritt is a 46 y.o. 46 y.o. right-handed African [place] female with hypertension, migraines, fibromyalgia, diabetes mellitus, GERD, depression and anxiety returning to the clinic for follow-up of migraines and pseudotumor cerebri.    History of present illness: Since around ~2010, she began having pressure over the right cheek which was initially treated for sinusitis.  Symptoms would come and go, but for the past year, the pain become more constant.  Pain is always located on the over the right cheek, described as a tooth ache. She does not have electrical shock-like, stabbing, or shooting pain.    She also has long history of migraines since being a teenager.  She reports having daily headaches previously and lasting all day. Triggers include stress, bright lights, and smell of smoke.  She has tried imitrex and topiramate without any relief.  She was started on carbmazepine 100mg  daily two weeks ago and has noticed 25% improvement.  Since taking carbmazpeine, headaches now occur about twice per week and are less intense.  Her facial pain has also improved.  UPDATE 08/22/2015:  She reports having resolution of facial pain with increasing her carbamazepine to 100mg  twice daily. She is tolerating the medication well.  Her headaches are also markedly improved, now occurring only per month and respond to Excedrin.   She is actively trying to loose weight and has lost 13lb since her last visit by making better diet choices.   UPDATE 03/13/2016:  She self tapered her carbamazepine to 100mg  daily soon after her last visit because her pain was doing well.  However, starting around late 2017, she started having worsening nagging pain over the right side of her face and her headaches returned.  Pain is sharp in her face and stabbing the head.  Pain lasts all day.  She has nausea,  photophobia, and phonophobia.  She also feels that she is drooling on the right side of her mouth.  She has tried excedrin migraine, which helps for a few hours only.  She denies having any adverse effects to carbamazepine.  UPDATE 09/18/2016:   Since her last visit, she had MRI brain which showed prominent optic nerve sheaths and was evaluated for intracranial hypertension with LP.  Opening pressure was 21cm H20 and CSF glucose was elevated 114.  Patient had been off her diabetic medication for several months. Although her pressures are only mildly elevated, she continued to have retroorbital pain and was started on diamox 250mg  twice daily, which initially helped her headaches.  However, of the past months, she started having right-sided headaches occurring 2-3 times per day.  Her right facial paresthesias have significantly improved, now only getting it rarely associated with severe migraines.   Dr. Mitzi Hansen with [facility] dated 06/17/2016. 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 Left eye is normal. Visual field testing was normal.  Medications:  Current Outpatient Prescriptions on File Prior to Visit  Medication Sig Dispense Refill  . acetaZOLAMIDE (DIAMOX) 250 MG tablet Take 1 tablet (250 mg total) by mouth 2 (two) times daily. 180 tablet 1  . amLODipine (NORVASC) 10 MG tablet Take 5 mg by mouth daily.     Marland Kitchen aspirin 81 MG tablet Take 81 mg by mouth daily.    Marland Kitchen atorvastatin (LIPITOR) 40 MG tablet Take 40 mg by mouth daily.    Marland Kitchen  Biotin 5000 MCG CAPS Take by mouth.    . canagliflozin (INVOKANA) 100 MG TABS tablet Take 100 mg by mouth daily before breakfast.    . carbamazepine (TEGRETOL XR) 100 MG 12 hr tablet Take 1 tablet (100 mg total) by mouth 2 (two) times daily. 180 tablet 3  . clonazePAM (KLONOPIN) 0.5 MG tablet Take 0.5 mg by mouth 2 (two) times daily as needed for anxiety.    . DULoxetine (CYMBALTA) 60 MG capsule Take 60 mg by mouth daily.      Marland Kitchen eletriptan (RELPAX) 20 MG tablet Take 1 tablet (20 mg total) by mouth as needed for migraine or headache. May repeat in 2 hours if headache persists or recurs. 10 tablet 2  . losartan-hydrochlorothiazide (HYZAAR) 100-25 MG tablet Take 1 tablet by mouth daily.    . metFORMIN (GLUCOPHAGE) 500 MG tablet Take by mouth 2 (two) times daily with a meal. 2 in the am and 1 in the evening    . methocarbamol (ROBAXIN) 500 MG tablet Take 500 mg by mouth 4 (four) times daily.    . ondansetron (ZOFRAN) 8 MG tablet Take by mouth every 8 (eight) hours as needed for nausea or vomiting.    . pantoprazole (PROTONIX) 40 MG tablet Take 40 mg by mouth daily.    . pregabalin (LYRICA) 150 MG capsule Take 150 mg by mouth 2 (two) times daily.    . promethazine (PHENERGAN) 25 MG tablet Take 25 mg by mouth every 6 (six) hours as needed for nausea or vomiting.     No current facility-administered medications on file prior to visit.     Allergies: No Known Allergies  Review of Systems:  CONSTITUTIONAL: No fevers, chills, night sweats, ++ intentional weight loss.  EYES: No visual changes or eye pain ENT: No hearing changes.  No history of nose bleeds.   RESPIRATORY: No cough, wheezing and shortness of breath.   CARDIOVASCULAR: Negative for chest pain, and palpitations.   GI: Negative for abdominal discomfort, blood in stools or black stools.  No recent change in bowel habits.   GU:  No history of incontinence.   MUSCLOSKELETAL: No history of joint pain or swelling.  No myalgias.   SKIN: Negative for lesions, rash, and itching.   ENDOCRINE: Negative for cold or heat intolerance, polydipsia or goiter.   PSYCH:  No depression or anxiety symptoms.   NEURO: As Above.   Vital Signs:  BP 130/90   Pulse 64   Ht 5\' 5"  (1.651 m)   Wt 265 lb 7 oz (120.4 kg)   BMI 44.17 kg/m   General Medical Exam:   General:  Well appearing, comfortable  Eyes/ENT: see cranial nerve examination.   Respiratory:  Clear to  auscultation, good air entry bilaterally.   Cardiac:  Regular rate and rhythm, no murmur.    Neurological Exam: MENTAL STATUS including orientation to time, place, person, recent and remote memory, attention span and concentration is normal  CRANIAL NERVES: Normal fundoscopic exam.  No visual field defects.  Pupils equal round and reactive to light.  Normal conjugate, extra-ocular eye movements in all directions of gaze.  No ptosis. Facial sensation intact.  Face is symmetric. Palate elevates symmetrically.  Tongue is midline.  MOTOR:  Motor strength is 5/5 in all extremities.  No pronator drift.  Tone is normal.    MSRs:  Reflexes are 2+/4 throughout.  SENSORY:  Intact to temperature and vibration.  COORDINATION/GAIT:  Normal finger-to- nose-finger.  Gait narrow based and  stable.    Data: Labs 02/01/2015:  HbA1c 6.9 Labs 09/22/2014:  TSH 2.030, vitamin B12 589 Lab Results  Component Value Date   TSH 2.29 03/13/2016   Lab Results  Component Value Date   [id number] 076 03/13/2016    CSF 05/28/2016: R37 W1  F114 P24  OP 21cmH20  MRI brain 05/19/2016: Prominent optic nerve sheaths, possible flattening of the optic papillae, could represent a manifestation of idiopathic intracranial hypertension. Correlate clinically as a cause of headaches. Lumbar puncture could be helpful in further evaluation. Otherwise unremarkable cranial MRI. No cause seen for LEFT hemisensory defect.  IMPRESSION/PLAN: 1.  Very mild pseudotumor cerebri with opening pressure 21cm H20 and MRI brain was personally viewed showing prominent optic nerve sheaths.  CSF studies were reviewed and discussed with patient. Evaluation by her eye doctor showed right pseudopapilledema.  Clinically, headaches are slightly worse, so will increase her diamox 500mg  BID.  If she is doing well, will try to reduce carbamazepine.  Praised patient for her weight loss, as this alone, can help manage pseudotumor cerebri  2.  Chronic  migraine with right facial paresthesias, improved.  Previously tried topiramate, imitrex and already taking Lyrica and Cymbalta for fibromyalgia  Continue carbmazepine 100mg  BID for now, if diamox controls pain better, will plan to taper this  3.  Morbid obesity - she has lost an impressive 30+lb!    Encouraged her to start exercise program and continue with her diet modification  Return to clinic in 6 months  Thank you for allowing me to participate in patient's care.  If I can answer any additional questions, I would be pleased to do so.    Sincerely,    Monterrio Gerst K. Posey Pronto, DO

## 2016-09-18 NOTE — Patient Instructions (Signed)
Increase Diamox to 500mg  twice daily  If your headaches are better on this dose for a month, you can reduce the carbamazepine to 100mg  daily  Excellent weight loss!!  Keep it up  Return to clinic in 6 months

## 2017-04-02 ENCOUNTER — Ambulatory Visit (INDEPENDENT_AMBULATORY_CARE_PROVIDER_SITE_OTHER): Payer: Managed Care, Other (non HMO) | Admitting: Neurology

## 2017-04-02 ENCOUNTER — Encounter: Payer: Self-pay | Admitting: Neurology

## 2017-04-02 VITALS — BP 110/80 | Resp 14 | Ht 65.0 in | Wt 253.0 lb

## 2017-04-02 DIAGNOSIS — G932 Benign intracranial hypertension: Secondary | ICD-10-CM | POA: Diagnosis not present

## 2017-04-02 DIAGNOSIS — G43709 Chronic migraine without aura, not intractable, without status migrainosus: Secondary | ICD-10-CM | POA: Diagnosis not present

## 2017-04-02 DIAGNOSIS — IMO0002 Reserved for concepts with insufficient information to code with codable children: Secondary | ICD-10-CM

## 2017-04-02 NOTE — Patient Instructions (Addendum)
Continue diamox 500mg  twice daily  Reduce carbamazepine to 100mg  daily for one week, then stop  Keep up the great work with your weight loss  Return to clinic in 6 months

## 2017-04-02 NOTE — Progress Notes (Signed)
Follow-up Visit   Date: 47/70/26    Crystal Merritt MRN: [id number] DOB: March 30, 1970   Interim History: Crystal Merritt is a 47 y.o. 47 y.o. right-handed [place] female with hypertension, migraines, fibromyalgia, diabetes mellitus, GERD, depression and anxiety returning to the clinic for follow-up of migraines and pseudotumor cerebri.    History of present illness: Since around ~2010, she began having pressure over the right cheek which was initially treated for sinusitis.  Symptoms would come and go, but for the past year, the pain become more constant.  Pain is always located on the over the right cheek, described as a tooth ache. She does not have electrical shock-like, stabbing, or shooting pain.    She also has long history of migraines since being a teenager.  She reports having daily headaches previously and lasting all day. Triggers include stress, bright lights, and smell of smoke.  She has tried imitrex and topiramate without any relief.  She was started on carbmazepine 100mg  daily two weeks ago and has noticed 25% improvement.  Since taking carbmazpeine, headaches now occur about twice per week and are less intense.  Her facial pain has resolved on carbamazepine to 100mg  twice daily.  In the spring of 2018, she taper carbamazepine to 100mg  daily and several months later, started having worsening pain.   UPDATE 09/18/2016:   Since her last visit, she had MRI brain which showed prominent optic nerve sheaths and was evaluated for intracranial hypertension with LP.  Opening pressure was 21cm H20 and CSF glucose was elevated 114.  Patient had been off her diabetic medication for several months. Although her pressures are only mildly elevated, she continued to have retroorbital pain and was started on diamox 250mg  twice daily, which initially helped her headaches.  However, of the past months, she started having right-sided headaches occurring 2-3 times per day.  Her right facial  paresthesias have significantly improved, now only getting it rarely associated with severe migraines.   Dr. Mitzi Hansen with [facility] dated 06/17/2016. 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 Left eye is normal. Visual field testing was normal.  UPDATE 04/02/2017:  She is here for follow-up visit and is doing well.  She continues to have headaches about 2-3 times per week and most of the time does not need to treat them.  She no longer has facial paresthesias. Her last eye evaluation shows normal appearing discs.  No new vision complaints.   Medications:  Current Outpatient Medications on File Prior to Visit  Medication Sig Dispense Refill  . acetaZOLAMIDE (DIAMOX) 250 MG tablet Take 2 tablets (500 mg total) by mouth 2 (two) times daily. 360 tablet 3  . amLODipine (NORVASC) 10 MG tablet Take 5 mg by mouth daily.     Marland Kitchen aspirin 81 MG tablet Take 81 mg by mouth daily.    Marland Kitchen atorvastatin (LIPITOR) 40 MG tablet Take 40 mg by mouth daily.    Marland Kitchen losartan-hydrochlorothiazide (HYZAAR) 100-25 MG tablet Take 1 tablet by mouth daily.    . metFORMIN (GLUCOPHAGE) 500 MG tablet Take by mouth 2 (two) times daily with a meal. 2 in the am and 1 in the evening    . methocarbamol (ROBAXIN) 500 MG tablet Take 500 mg by mouth 4 (four) times daily.    . ondansetron (ZOFRAN) 8 MG tablet Take by mouth every 8 (eight) hours as needed for nausea or vomiting.    . pantoprazole (PROTONIX) 40 MG  tablet Take 40 mg by mouth daily.    . promethazine (PHENERGAN) 25 MG tablet Take 25 mg by mouth every 6 (six) hours as needed for nausea or vomiting.     No current facility-administered medications on file prior to visit.     Allergies: No Known Allergies  Review of Systems:  CONSTITUTIONAL: No fevers, chills, night sweats, ++ intentional weight loss.  EYES: No visual changes or eye pain ENT: No hearing changes.  No history of nose bleeds.   RESPIRATORY: No cough, wheezing and  shortness of breath.   CARDIOVASCULAR: Negative for chest pain, and palpitations.   GI: Negative for abdominal discomfort, blood in stools or black stools.  No recent change in bowel habits.   GU:  No history of incontinence.   MUSCLOSKELETAL: No history of joint pain or swelling.  No myalgias.   SKIN: Negative for lesions, rash, and itching.   ENDOCRINE: Negative for cold or heat intolerance, polydipsia or goiter.   PSYCH:  No depression or anxiety symptoms.   NEURO: As Above.   Vital Signs:  BP 110/80   Resp 14   Ht 5\' 5"  (1.651 m)   Wt 253 lb (114.8 kg)   BMI 42.10 kg/m   General Medical Exam:   General:  Well appearing, comfortable .    Neurological Exam: MENTAL STATUS including orientation to time, place, person, recent and remote memory, attention span and concentration is normal  CRANIAL NERVES: Normal fundoscopic exam.  No visual field defects.  Pupils equal round and reactive to light.  Normal conjugate, extra-ocular eye movements in all directions of gaze.  No ptosis. Facial sensation intact.  Face is symmetric. Palate elevates symmetrically.  Tongue is midline.  MOTOR:  Motor strength is 5/5 in all extremities.    COORDINATION/GAIT:  Gait narrow based and stable.    Data: Labs 02/01/2015:  HbA1c 6.9 Labs 09/22/2014:  TSH 2.030, vitamin B12 589 Lab Results  Component Value Date   TSH 2.29 03/13/2016   Lab Results  Component Value Date   [id number] 950 03/13/2016    CSF 05/28/2016: R37 W1  F114 P24  OP 21cmH20  MRI brain 05/19/2016: Prominent optic nerve sheaths, possible flattening of the optic papillae, could represent a manifestation of idiopathic intracranial hypertension. Correlate clinically as a cause of headaches. Lumbar puncture could be helpful in further evaluation. Otherwise unremarkable cranial MRI. No cause seen for LEFT hemisensory defect.  IMPRESSION/PLAN: 1.  Pseudotumor cerebri - very mild (OP 21cm H2O), but with prominent optic nerve sheaths.   Headaches are improved, now occurring 2-3 times per week.  Continue diamox 500mg  twice daily.  She continues to loose weight and was encouraged to keep it up  2.  Chronic migraine with right facial paresthesias, resolved.  Previously tried topiramate, imitrex and already taking Lyrica and Cymbalta for fibromyalgia  Taper carbmazepine 100mg  daily x 1 week, then stop  Return to clinic in 6 months   Thank you for allowing me to participate in patient's care.  If I can answer any additional questions, I would be pleased to do so.    Sincerely,    Keven Soucy K. Posey Pronto, DO

## 2017-05-02 ENCOUNTER — Other Ambulatory Visit: Payer: Self-pay | Admitting: Neurology

## 2017-09-17 ENCOUNTER — Ambulatory Visit: Payer: Managed Care, Other (non HMO) | Admitting: Neurology

## 2017-11-02 ENCOUNTER — Other Ambulatory Visit: Payer: Self-pay | Admitting: Neurology

## 2017-11-26 ENCOUNTER — Ambulatory Visit (INDEPENDENT_AMBULATORY_CARE_PROVIDER_SITE_OTHER): Payer: Managed Care, Other (non HMO) | Admitting: Neurology

## 2017-11-26 ENCOUNTER — Encounter: Payer: Self-pay | Admitting: Neurology

## 2017-11-26 VITALS — BP 110/88 | HR 67 | Ht 65.0 in | Wt 248.4 lb

## 2017-11-26 DIAGNOSIS — R51 Headache: Secondary | ICD-10-CM | POA: Diagnosis not present

## 2017-11-26 DIAGNOSIS — G932 Benign intracranial hypertension: Secondary | ICD-10-CM

## 2017-11-26 DIAGNOSIS — R519 Headache, unspecified: Secondary | ICD-10-CM

## 2017-11-26 MED ORDER — CARBAMAZEPINE ER 100 MG PO TB12: 100.0000 mg | ORAL_TABLET | Freq: Two times a day (BID) | ORAL | 1 refills | Status: DC

## 2017-11-26 NOTE — Progress Notes (Signed)
Follow-up Visit   Date: 60/73/71    Crystal Merritt MRN: [id number] DOB: 01/16/1970   Interim History: Crystal Merritt is a 47 y.o. 47 y.o. right-handed African [place] female with hypertension, migraines, fibromyalgia, diabetes mellitus, GERD, depression and anxiety returning to the clinic for follow-up of migraines and pseudotumor cerebri.    History of present illness: Since around ~2010, she began having pressure over the right cheek which was initially treated for sinusitis.  Symptoms would come and go, but for the past year, the pain become more constant.  Pain is always located on the over the right cheek, described as a tooth ache. She does not have electrical shock-like, stabbing, or shooting pain.    She also has long history of migraines since being a teenager.  She reports having daily headaches previously and lasting all day. Triggers include stress, bright lights, and smell of smoke.  She has tried imitrex and topiramate without any relief.  She was started on carbmazepine 100mg  daily two weeks ago and has noticed 25% improvement.  Since taking carbmazpeine, headaches now occur about twice per week and are less intense.  Her facial pain has resolved on carbamazepine to 100mg  twice daily.  In the spring of 2018, she taper carbamazepine to 100mg  daily and several months later, started having worsening pain.   UPDATE 09/18/2016:   Since her last visit, she had MRI brain which showed prominent optic nerve sheaths and was evaluated for intracranial hypertension with LP.  Opening pressure was 21cm H20 and CSF glucose was elevated 114.  Patient had been off her diabetic medication for several months. Although her pressures are only mildly elevated, she continued to have retroorbital pain and was started on diamox 250mg  twice daily, which initially helped her headaches.  However, of the past months, she started having right-sided headaches occurring 2-3 times per day.  Her right facial  paresthesias have significantly improved, now only getting it rarely associated with severe migraines.   Dr. Mitzi Hansen with [facility] dated 06/17/2016. 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 Left eye is normal. Visual field testing was normal.  UPDATE 11/26/2017:  Headaches were doing really well over the summer occurring about 2-4 times per month, however, since the weather is cooler, she is having more frequent headaches.  Over the past few days, the numbness over the right side of the face has returned.  It is not as bothersome as it previously was..  Eye exam shows mild changes in visual acuity, no papilledema.  She denies any new patient complaints.  Medications:  Current Outpatient Medications on File Prior to Visit  Medication Sig Dispense Refill  . acetaZOLAMIDE (DIAMOX) 250 MG tablet TAKE 2 TABLETS BY MOUTH TWICE A DAY 360 tablet 0  . amLODipine (NORVASC) 10 MG tablet Take 5 mg by mouth daily.     Marland Kitchen aspirin 81 MG tablet Take 81 mg by mouth daily.    Marland Kitchen atorvastatin (LIPITOR) 40 MG tablet Take 40 mg by mouth daily.    Marland Kitchen glipiZIDE (GLUCOTROL) 10 MG tablet     . losartan-hydrochlorothiazide (HYZAAR) 100-25 MG tablet Take 1 tablet by mouth daily.    . methocarbamol (ROBAXIN) 500 MG tablet Take 500 mg by mouth 4 (four) times daily.    . ondansetron (ZOFRAN) 8 MG tablet Take by mouth every 8 (eight) hours as needed for nausea or vomiting.    . pantoprazole (PROTONIX) 40 MG tablet Take 40 mg  by mouth daily.    . promethazine (PHENERGAN) 25 MG tablet Take 25 mg by mouth every 6 (six) hours as needed for nausea or vomiting.    . venlafaxine XR (EFFEXOR-XR) 37.5 MG 24 hr capsule Take by mouth daily.  2   No current facility-administered medications on file prior to visit.     Allergies: No Known Allergies  Review of Systems:  CONSTITUTIONAL: No fevers, chills, night sweats, ++ intentional weight loss.  EYES: No visual changes or eye pain ENT:  No hearing changes.  No history of nose bleeds.   RESPIRATORY: No cough, wheezing and shortness of breath.   CARDIOVASCULAR: Negative for chest pain, and palpitations.   GI: Negative for abdominal discomfort, blood in stools or black stools.  No recent change in bowel habits.   GU:  No history of incontinence.   MUSCLOSKELETAL: No history of joint pain or swelling.  No myalgias.   SKIN: Negative for lesions, rash, and itching.   ENDOCRINE: Negative for cold or heat intolerance, polydipsia or goiter.   PSYCH:  No depression or anxiety symptoms.   NEURO: As Above.   Vital Signs:  BP 110/88   Pulse 67   Ht 5\' 5"  (1.651 m)   Wt 248 lb 6 oz (112.7 kg)   SpO2 99%   BMI 41.33 kg/m   General Medical Exam:   General:  Well appearing, comfortable  Eyes/ENT: see cranial nerve examination.   Neck: No masses appreciated.  Full range of motion without tenderness.  No carotid bruits. Respiratory:  Clear to auscultation, good air entry bilaterally.   Cardiac:  Regular rate and rhythm, no murmur.   Ext:  No edema  Neurological Exam: MENTAL STATUS including orientation to time, place, person, recent and remote memory, attention span and concentration is normal  CRANIAL NERVES: No papilledema on funduscopic exam.  No visual field defects.  Pupils equal round and reactive to light.  Normal conjugate, extra-ocular eye movements in all directions of gaze.  No ptosis. Facial sensation shows mildly reduced temperature left cheek.  Face is symmetric. Palate elevates symmetrically.  Tongue is midline.  Jaw jerk is absent.  MOTOR:  Motor strength is 5/5 in all extremities.    REFLEXES: Reflexes are 2+/4 throughout.  SENSORY: Vibration is intact throughout.  COORDINATION/GAIT:  Gait narrow based and stable.    Data: Labs 02/01/2015:  HbA1c 6.9 Labs 09/22/2014:  TSH 2.030, vitamin B12 589 Lab Results  Component Value Date   TSH 2.29 03/13/2016   Lab Results  Component Value Date   [id number]  338 03/13/2016   CSF 05/28/2016: R37 W1  G114 P24  OP 21cmH20  MRI brain 05/19/2016: Prominent optic nerve sheaths, possible flattening of the optic papillae, could represent a manifestation of idiopathic intracranial hypertension. Correlate clinically as a cause of headaches. Lumbar puncture could be helpful in further evaluation. Otherwise unremarkable cranial MRI. No cause seen for LEFT hemisensory defect.  IMPRESSION/PLAN: 1.  Pseudotumor cerebri - very mild (OP 21cm H2O), but with prominent optic nerve sheaths.  No papilledema on exam.  She continues to have headaches about 2-3 times per week.  Continue diamox 500mg  twice daily.  May consider tapering Diamox in the future, if headaches are better controlled.  2.  Chronic migraine with right facial paresthesias, worsening.    Previously tried topiramate, imitrex and already taking Lyrica and Cymbalta for fibromyalgia  Start carbmazepine 100mg  BID for facial paresthesias.   Return to clinic in 6 months  Thank you  for allowing me to participate in patient's care.  If I can answer any additional questions, I would be pleased to do so.    Sincerely,     K. Posey Pronto, DO

## 2017-11-26 NOTE — Patient Instructions (Signed)
Start carbamazepine 100mg  twice daily  Continue Diamox 500mg  twice daily  Return to clinic in 6 months or sooner as needed

## 2018-01-17 ENCOUNTER — Encounter

## 2018-01-17 ENCOUNTER — Ambulatory Visit: Payer: Managed Care, Other (non HMO) | Admitting: Neurology

## 2018-01-19 ENCOUNTER — Other Ambulatory Visit: Payer: Self-pay | Admitting: Neurology

## 2018-05-09 ENCOUNTER — Encounter: Payer: Self-pay | Admitting: Neurology

## 2018-05-27 ENCOUNTER — Ambulatory Visit: Payer: Managed Care, Other (non HMO) | Admitting: Neurology

## 2018-05-30 ENCOUNTER — Other Ambulatory Visit: Payer: Self-pay

## 2018-05-30 ENCOUNTER — Encounter: Payer: Self-pay | Admitting: Neurology

## 2018-05-30 ENCOUNTER — Telehealth (INDEPENDENT_AMBULATORY_CARE_PROVIDER_SITE_OTHER): Payer: Managed Care, Other (non HMO) | Admitting: Neurology

## 2018-05-30 VITALS — Ht 65.0 in | Wt 255.0 lb

## 2018-05-30 DIAGNOSIS — IMO0002 Reserved for concepts with insufficient information to code with codable children: Secondary | ICD-10-CM

## 2018-05-30 DIAGNOSIS — G932 Benign intracranial hypertension: Secondary | ICD-10-CM

## 2018-05-30 DIAGNOSIS — G43709 Chronic migraine without aura, not intractable, without status migrainosus: Secondary | ICD-10-CM

## 2018-05-30 MED ORDER — CARBAMAZEPINE ER 100 MG PO TB12: 100.0000 mg | ORAL_TABLET | Freq: Two times a day (BID) | ORAL | 1 refills | Status: DC

## 2018-05-30 NOTE — Progress Notes (Signed)
   Virtual Visit via Video Note The purpose of this virtual visit is to provide medical care while limiting exposure to the novel coronavirus.    Consent was obtained for video visit:  Yes.   Answered questions that patient had about telehealth interaction:  Yes.   I discussed the limitations, risks, security and privacy concerns of performing an evaluation and management service by telemedicine. I also discussed with the patient that there may be a patient responsible charge related to this service. The patient expressed understanding and agreed to proceed.  Pt location: Home Physician Location: office Name of referring provider:  Monico Blitz, MD I connected with Lenora Boys at patients initiation/request on 05/30/2018 at  1:00 PM EDT by video enabled telemedicine application and verified that I am speaking with the correct person using two identifiers. Pt MRN:  [id number] Pt DOB:  October 13, 1970 Video Participants:  Lenora Boys   History of Present Illness: This is a 48 y.o. female returning for follow-up of migraines and pseudotumor cerebri.  Her headaches are relatively well controlled, occurring twice per week and usually triggered by stress.  She is working third shift as a Animal nutritionist and feels that her shift work can lead to sleep disturbance and worsening headaches.  She is tolerating Diamox 500 mg twice daily well.  She denies any new visual symptoms.  Right facial paresthesias have resolved on carbamazepine 100 mg twice daily.   Observations/Objective:   Vitals:   05/30/18 0852  Weight: 255 lb (115.7 kg)  Height: 5\' 5"  (1.651 m)   Patient is awake, alert, and appears comfortable.  Oriented x 4.   Extraocular muscles are intact. No ptosis.  Face is symmetric.  Speech is not dysarthric. Antigravity in all extremities.    Assessment and Plan:  1.  Pseudotumor cerebri, very mild opening pressure 21cm H2O, with prominent optic nerve sheaths.  She has been doing very  well and denies any new visual symptoms. Continue Diamox 500 mg twice daily Recommend annual eye exam  2.  Chronic migraine with right facial paresthesias, improved.  Headaches are occurring about twice per week and respond to Excedrin.  Right facial paresthesias are resolved. Previously tried topiramate, Imitrex, and already taking Lyrica and Cymbalta for fibromyalgia Continue carbamazepine 100 mg twice daily   Follow Up Instructions:   I discussed the assessment and treatment plan with the patient. The patient was provided an opportunity to ask questions and all were answered. The patient agreed with the plan and demonstrated an understanding of the instructions.   The patient was advised to call back or seek an in-person evaluation if the symptoms worsen or if the condition fails to improve as anticipated.  Follow-up in 6 months   Alda Berthold, DO

## 2018-09-02 ENCOUNTER — Telehealth: Payer: Self-pay | Admitting: Neurology

## 2018-09-02 NOTE — Telephone Encounter (Signed)
done

## 2018-09-02 NOTE — Telephone Encounter (Signed)
Left message with the after hour service on 09-02-18 @ 11:58 am    Patient states that she is starting a new job and needs a note that she has her piercing  for her migraine. They want her to take it out  The fax number is [phone] Attn: Cecille Rubin

## 2018-12-27 DIAGNOSIS — K219 Gastro-esophageal reflux disease without esophagitis: Secondary | ICD-10-CM | POA: Insufficient documentation

## 2018-12-27 DIAGNOSIS — I10 Essential (primary) hypertension: Secondary | ICD-10-CM

## 2018-12-27 DIAGNOSIS — E119 Type 2 diabetes mellitus without complications: Secondary | ICD-10-CM | POA: Insufficient documentation

## 2018-12-27 DIAGNOSIS — E785 Hyperlipidemia, unspecified: Secondary | ICD-10-CM

## 2018-12-27 DIAGNOSIS — G5600 Carpal tunnel syndrome, unspecified upper limb: Secondary | ICD-10-CM | POA: Insufficient documentation

## 2018-12-27 HISTORY — DX: Essential (primary) hypertension: I10

## 2018-12-27 HISTORY — DX: Gastro-esophageal reflux disease without esophagitis: K21.9

## 2018-12-27 HISTORY — DX: Carpal tunnel syndrome, unspecified upper limb: G56.00

## 2018-12-27 HISTORY — DX: Hyperlipidemia, unspecified: E78.5

## 2018-12-27 HISTORY — DX: Type 2 diabetes mellitus without complications: E11.9

## 2018-12-29 ENCOUNTER — Encounter: Payer: Self-pay | Admitting: Neurology

## 2018-12-29 NOTE — Progress Notes (Signed)
   Virtual Visit via Video Note The purpose of this virtual visit is to provide medical care while limiting exposure to the novel coronavirus.    Consent was obtained for video visit:  Yes.   Answered questions that patient had about telehealth interaction:  Yes.   I discussed the limitations, risks, security and privacy concerns of performing an evaluation and management service by telemedicine. I also discussed with the patient that there may be a patient responsible charge related to this service. The patient expressed understanding and agreed to proceed.  Pt location: Home Physician Location: office Name of referring provider:  Monico Blitz, MD I connected with Crystal Merritt at patients initiation/request on 12/30/2018 at 10:30 AM EST by video enabled telemedicine application and verified that I am speaking with the correct person using two identifiers. Pt MRN:  [id number] Pt DOB:  05-19-1970 Video Participants:  Crystal Merritt   History of Present Illness: This is a 48 y.o. female returning for follow-up of migraines and pseudotumor cerebri. She is compliant with taking diamox 500mg  BID and carbamazepine 100mg  BID.  She had eye exam in the summer which shows mild change in acuity, no papilledema. Her headaches were well controlled until a few months ago, then developed pressure-like pain and was treated with two rounds of antibiotics for sinusitis.  Her headaches are much better after getting over her infection.  Right facial paresthesia are infrequent and only occurs when she skips her medication.  She also complains of chronic low back pain and has been seeing a chiropractor for 2 years.  She has x-ray which was normal.  She has not done PT.  She has fallen a few times, but does not report leg weakness.   Observations/Objective:   Vitals:   12/29/18 1440  Weight: 240 lb (108.9 kg)  Height: 5\' 5"  (1.651 m)   Patient is awake, alert, and appears comfortable.  Oriented x 4. Obese.    Extraocular muscles are intact. No ptosis.  Face is symmetric.  Speech is not dysarthric. Antigravity in all extremities.   Gait is wide-based due to body habitus  Assessment and Plan:  1.  Pseudotumor cerebri, very mild opening pressure 21cm H2O, with prominent optic nerve sheaths.  Clinically stable, with no visual complaints.  Eye exam performed in the summer of 2020 did not show papilledema. Continue Diamox 500 mg twice daily - refills provided for 1 year  2.  Chronic migraine with right facial paresthesias, improved. Previously tried topiramate, Imitrex.  Continue carbamazepine 100 mg twice daily - refills provided for 1 year  3.  Chronic low back pain, likely lumbar strain.  PT recommended, she would like to think about it.   Follow Up Instructions:   I discussed the assessment and treatment plan with the patient. The patient was provided an opportunity to ask questions and all were answered. The patient agreed with the plan and demonstrated an understanding of the instructions.   The patient was advised to call back or seek an in-person evaluation if the symptoms worsen or if the condition fails to improve as anticipated.  Follow-up in 9 months   [place], DO

## 2018-12-30 ENCOUNTER — Other Ambulatory Visit: Payer: Self-pay

## 2018-12-30 ENCOUNTER — Telehealth (INDEPENDENT_AMBULATORY_CARE_PROVIDER_SITE_OTHER): Payer: Managed Care, Other (non HMO) | Admitting: Neurology

## 2018-12-30 VITALS — Ht 65.0 in | Wt 240.0 lb

## 2018-12-30 DIAGNOSIS — M545 Low back pain: Secondary | ICD-10-CM

## 2018-12-30 DIAGNOSIS — G932 Benign intracranial hypertension: Secondary | ICD-10-CM

## 2018-12-30 DIAGNOSIS — IMO0002 Reserved for concepts with insufficient information to code with codable children: Secondary | ICD-10-CM

## 2018-12-30 DIAGNOSIS — R202 Paresthesia of skin: Secondary | ICD-10-CM | POA: Diagnosis not present

## 2018-12-30 DIAGNOSIS — S39012A Strain of muscle, fascia and tendon of lower back, initial encounter: Secondary | ICD-10-CM

## 2018-12-30 DIAGNOSIS — G43709 Chronic migraine without aura, not intractable, without status migrainosus: Secondary | ICD-10-CM

## 2018-12-30 DIAGNOSIS — G43909 Migraine, unspecified, not intractable, without status migrainosus: Secondary | ICD-10-CM

## 2018-12-30 DIAGNOSIS — R519 Headache, unspecified: Secondary | ICD-10-CM

## 2018-12-30 DIAGNOSIS — G8929 Other chronic pain: Secondary | ICD-10-CM

## 2018-12-30 MED ORDER — ACETAZOLAMIDE 250 MG PO TABS: 500.0000 mg | ORAL_TABLET | Freq: Two times a day (BID) | ORAL | 3 refills | Status: DC

## 2018-12-30 MED ORDER — CARBAMAZEPINE ER 100 MG PO TB12: 100.0000 mg | ORAL_TABLET | Freq: Two times a day (BID) | ORAL | 3 refills | Status: DC

## 2019-05-17 ENCOUNTER — Other Ambulatory Visit: Payer: Self-pay

## 2019-05-17 ENCOUNTER — Ambulatory Visit (INDEPENDENT_AMBULATORY_CARE_PROVIDER_SITE_OTHER): Payer: BC Managed Care – PPO | Admitting: Orthopaedic Surgery

## 2019-05-17 ENCOUNTER — Encounter: Payer: Self-pay | Admitting: Orthopaedic Surgery

## 2019-05-17 DIAGNOSIS — M79641 Pain in right hand: Secondary | ICD-10-CM | POA: Diagnosis not present

## 2019-05-17 HISTORY — DX: Pain in right hand: M79.641

## 2019-05-17 NOTE — Progress Notes (Signed)
Office Visit Note   Patient: Crystal Merritt           Date of Birth: [birth year]           MRN: [id number] Visit Date: 05/17/2019              Requested by: Monico Blitz, MD [address] [place],  [place] [address] PCP: Monico Blitz, MD   Assessment & Plan: Visit Diagnoses:  1. Pain in right hand     Plan: Injury to right hand 5 days ago with some discomfort around the base of her thumb and at the metacarpal phalangeal joint.  Films are negative.  We will keep her out of work and apply a thumb spica splint.  Follow-up in 1 week.  I suspect this is soft tissue probable strain of the thumb at its base and/or metacarpal phalangeal joint  Follow-Up Instructions: Return in about 1 week (around 05/24/2019).   Orders:  No orders of the defined types were placed in this encounter.  No orders of the defined types were placed in this encounter.     Procedures: No procedures performed   Clinical Data: No additional findings.   Subjective: Chief Complaint  Patient presents with  . Right Hand - Pain, Injury    DOI 05/11/2019  Patient presents today for right hand pain. She was going down steps on 05/11/2019 and missed a step. She said that she caught herself and landed on her right hand. She went to [facility] the next day for x-rays. She is having pain in her right thumb and lateral wrist. She said that it was initially swollen, but has since returned almost to normal size. She is taking Ibuprofen every 6 hours and states that it does not seem to be helping. She is right hand dominant. I reviewed the films of her right hand and wrist on the PACS system.  No obvious acute changes.  Might have a little narrowing at the carpometacarpal joint of the thumb  HPI  Review of Systems   Objective: Vital Signs: Ht 5\' 5"  (1.651 m)   Wt 236 lb (107 kg)   BMI 39.27 kg/m   Physical Exam Constitutional:      Appearance: She is well-developed.  Eyes:     Pupils: Pupils are equal, round, and  reactive to light.  Pulmonary:     Effort: Pulmonary effort is normal.  Skin:    General: Skin is warm and dry.  Neurological:     Mental Status: She is alert and oriented to person, place, and time.  Psychiatric:        Behavior: Behavior normal.     Ortho Exam  Specialty Comments:  No specialty comments available.  Imaging: No results found.   PMFS History: Patient Active Problem List   Diagnosis Date Noted  . Pain in right hand 05/17/2019  . Carpal tunnel syndrome 12/27/2018  . Essential hypertension, benign 12/27/2018  . Gastroesophageal reflux 12/27/2018  . Obesity, morbid ([place]) 12/27/2018  . Other and unspecified hyperlipidemia 12/27/2018  . Type II or unspecified type diabetes mellitus without mention of complication, not stated as uncontrolled 12/27/2018  . Atypical facial pain 08/22/2015  . Chronic migraine 08/22/2015  . Hypertrophic and atrophic condition of skin 07/12/2008  . Signs and symptoms involving emotional state 03/07/2007  . 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 Contusion of knee 12/17/2006  . Abnormal mammogram 07/30/2006  . 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 Adjustment disorder  with depressed mood 08/21/2005  . Anxiety 08/21/2005  . Anemia 04/30/2005  . Fatigue 04/30/2005  . Hypokalemia 04/30/2005   Past Medical History:  Diagnosis Date  . Anxiety   . Backache   . Chest pain   . Chronic neck pain   . CTS (carpal tunnel syndrome)   . Depressive disorder   . Fatigue   . Fibromyalgia   . GERD (gastroesophageal reflux disease)   . Hyperlipidemia   . Hypersomnia   . Hypertension   . Migraine   . MODY 1, uncomplicated, uncontrolled   . Morbid obesity ([place])   . Paresthesia of arm   . Trigeminal neuralgia     Family History  Problem Relation Age of Onset  . Hypertension Mother        Deceased  . Diabetes Mother   . Heart disease Brother   . Diabetes Father        Deceased  . Diabetes Brother   . Kidney  failure Brother        Deceased    Past Surgical History:  Procedure Laterality Date  . PARTIAL HYSTERECTOMY     Social History   Occupational History  . Occupation: call center  Tobacco Use  . Smoking status: Former Research scientist (life sciences)  . Smokeless tobacco: Never Used  Substance and Sexual Activity  . Alcohol use: Yes    Alcohol/week: 0.0 standard drinks    Comment: Rare  . Drug use: No  . Sexual activity: Not on file

## 2019-05-24 ENCOUNTER — Other Ambulatory Visit: Payer: Self-pay

## 2019-05-24 ENCOUNTER — Encounter: Payer: Self-pay | Admitting: Orthopaedic Surgery

## 2019-05-24 ENCOUNTER — Ambulatory Visit (INDEPENDENT_AMBULATORY_CARE_PROVIDER_SITE_OTHER): Payer: BC Managed Care – PPO | Admitting: Orthopaedic Surgery

## 2019-05-24 VITALS — BP 138/87 | HR 70 | Ht 65.0 in | Wt 239.0 lb

## 2019-05-24 DIAGNOSIS — M79644 Pain in right finger(s): Secondary | ICD-10-CM | POA: Diagnosis not present

## 2019-05-24 NOTE — Progress Notes (Signed)
Patient [id number] Crystal Merritt, female DOB:[birth year], 49 y.o. [id number]  Chief Complaint  Patient presents with  . Hand Pain    R/brace not helping thumb/does't feel like hand is any better    HPI  Crystal Merritt is a 49 y.o. female who has right thumb pain.  She saw Dr. Durward Fortes last week.  I have read his notes. She was given a splint for the thumb and wrist but it irritated her and rubbed where she was hurting the most so she stopped it.  She still has pain with use of the right dominant thumb more at the metacarpal phalangeal joint.  She has some swelling at times.  She finds the pain goes up and down depending on use of the hand.  She has no new trauma, no redness, no numbness.   Body mass index is 39.77 kg/m.  ROS  Review of Systems  Constitutional: Positive for activity change.  Musculoskeletal: Positive for arthralgias and joint swelling.  All other systems reviewed and are negative.  For Review of Systems, all other systems reviewed and are negative.  The following is a summary of the past history medically, past history surgically, known current medicines, social history and family history.  This information is gathered electronically by the computer from prior information and documentation.  I review this each visit and have found including this information at this point in the chart is beneficial and informative.   Past Medical History:  Diagnosis Date  . Anxiety   . Backache   . Chest pain   . Chronic neck pain   . CTS (carpal tunnel syndrome)   . Depressive disorder   . Fatigue   . Fibromyalgia   . GERD (gastroesophageal reflux disease)   . Hyperlipidemia   . Hypersomnia   . Hypertension   . Migraine   . MODY 1, uncomplicated, uncontrolled   . Morbid obesity ([place])   . Paresthesia of arm   . Trigeminal neuralgia     Past Surgical History:  Procedure Laterality Date  . PARTIAL HYSTERECTOMY      Current Outpatient Medications on File Prior to Visit   Medication Sig Dispense Refill  . acetaZOLAMIDE (DIAMOX) 250 MG tablet Take 2 tablets (500 mg total) by mouth 2 (two) times daily. 360 tablet 3  . amLODipine (NORVASC) 10 MG tablet Take 5 mg by mouth daily.     Marland Kitchen aspirin 81 MG tablet Take 81 mg by mouth daily.    Marland Kitchen atorvastatin (LIPITOR) 40 MG tablet Take 40 mg by mouth daily.    . carbamazepine (TEGRETOL XR) 100 MG 12 hr tablet Take 1 tablet (100 mg total) by mouth 2 (two) times daily. 180 tablet 3  . dapagliflozin propanediol (FARXIGA) 10 MG TABS tablet Take 10 mg by mouth daily.    . folic acid (FOLVITE) 1 MG tablet Take 1 mg by mouth daily.    Marland Kitchen glipiZIDE (GLUCOTROL) 10 MG tablet     . losartan-hydrochlorothiazide (HYZAAR) 100-25 MG tablet Take 1 tablet by mouth daily.    . methocarbamol (ROBAXIN) 500 MG tablet Take 500 mg by mouth 4 (four) times daily.    . ondansetron (ZOFRAN) 8 MG tablet Take by mouth every 8 (eight) hours as needed for nausea or vomiting.    . pantoprazole (PROTONIX) 40 MG tablet Take 40 mg by mouth daily.    . promethazine (PHENERGAN) 25 MG tablet Take 25 mg by mouth every 6 (six) hours as needed for nausea or vomiting.    Marland Kitchen  venlafaxine XR (EFFEXOR-XR) 37.5 MG 24 hr capsule Take by mouth daily.  2   No current facility-administered medications on file prior to visit.    Social History   Socioeconomic History  . Marital status: Married    Spouse name: Not on file  . Number of children: 2  . Years of education: 35  . Highest education level: Associate degree: occupational, Hotel manager, or vocational program  Occupational History  . Occupation: call center  Tobacco Use  . Smoking status: Former Research scientist (life sciences)  . Smokeless tobacco: Never Used  Substance and Sexual Activity  . Alcohol use: Yes    Alcohol/week: 0.0 standard drinks    Comment: Rare  . Drug use: No  . Sexual activity: Not on file  Other Topics Concern  . Not on file  Social History Narrative   Lives with husband in a 2 story home.  Has 2 children.      Works in a call center Development worker, international aid)    Education: 2 year college degree.      05/30/18- Pt admits to a fall about one monthago. She c/o left leg weakness and back pain.  She lives in a split level home with stairs. She is right handed.   Social Determinants of Health   Financial Resource Strain:   . Difficulty of Paying Living Expenses:   Food Insecurity:   . Worried About Charity fundraiser in the Last Year:   . Arboriculturist in the Last Year:   Transportation Needs:   . Film/video editor (Medical):   Marland Kitchen Lack of Transportation (Non-Medical):   Physical Activity:   . Days of Exercise per Week:   . Minutes of Exercise per Session:   Stress:   . Feeling of Stress :   Social Connections:   . Frequency of Communication with Friends and Family:   . Frequency of Social Gatherings with Friends and Family:   . Attends Religious Services:   . Active Member of Clubs or Organizations:   . Attends Archivist Meetings:   Marland Kitchen Marital Status:   Intimate Partner Violence:   . Fear of Current or Ex-Partner:   . Emotionally Abused:   Marland Kitchen Physically Abused:   . Sexually Abused:     Family History  Problem Relation Age of Onset  . Hypertension Mother        Deceased  . Diabetes Mother   . Heart disease Brother   . Diabetes Father        Deceased  . Diabetes Brother   . Kidney failure Brother        Deceased    BP 138/87   Pulse 70   Ht 5\' 5"  (1.651 m)   Wt 239 lb (108.4 kg)   BMI 39.77 kg/m   Body mass index is 39.77 kg/m.      Physical Exam  Blood pressure 138/87, pulse 70, height 5\' 5"  (1.651 m), weight 239 lb (108.4 kg).  Constitutional: overall normal hygiene, normal nutrition, well developed, normal grooming, normal body habitus. Assistive device:none  Musculoskeletal: gait and station Limp none, muscle tone and strength are normal, no tremors or atrophy is present.  .  Neurological: coordination overall normal.  Deep tendon reflex/nerve  stretch intact.  Sensation normal.  Cranial nerves II-XII intact.   Skin:   Normal overall no scars, lesions, ulcers or rashes. No psoriasis.  Psychiatric: Alert and oriented x 3.  Recent memory intact, remote memory unclear.  Normal mood  and affect. Well groomed.  Good eye contact.  Cardiovascular: overall no swelling, no varicosities, no edema bilaterally, normal temperatures of the legs and arms, no clubbing, cyanosis and good capillary refill.  Lymphatic: palpation is normal.  She is tender over the first MCP joint of the right hand, no swelling, no redness. ROM is full but tender.  Collateral ligaments are intact.  Sensation intact.  She has slight tenderness of the dorsal radial wrist.  Grips are good.  All other systems reviewed and are negative   The patient has been educated about the nature of the problem(s) and counseled on treatment options.  The patient appeared to understand what I have discussed and is in agreement with it.  Encounter Diagnosis  Name Primary?  . Pain of right thumb Yes   I have told her she has a strain.  I have changed her to another splint, a thumb splint that fits her well and does not bother her "bad spot".    I have told her to use Aspercreme, BioFreeze or Voltaren gel to the area tid to qid.  I have told her to use warm water and sponge or cloth and exercise hand.  PLAN Call if any problems.  Precautions discussed.  Continue current medications.   Return to clinic 1 week   See Dr. Durward Fortes next week.  Call if any problem.  Precautions discussed.   Electronically Signed Sanjuana Kava, MD 5/12/202110:19 AM

## 2019-06-01 ENCOUNTER — Other Ambulatory Visit: Payer: Self-pay

## 2019-06-01 ENCOUNTER — Ambulatory Visit (INDEPENDENT_AMBULATORY_CARE_PROVIDER_SITE_OTHER): Payer: BC Managed Care – PPO | Admitting: Orthopaedic Surgery

## 2019-06-01 ENCOUNTER — Encounter: Payer: Self-pay | Admitting: Orthopaedic Surgery

## 2019-06-01 VITALS — Ht 65.0 in | Wt 240.0 lb

## 2019-06-01 DIAGNOSIS — M79641 Pain in right hand: Secondary | ICD-10-CM | POA: Diagnosis not present

## 2019-06-01 NOTE — Progress Notes (Signed)
Office Visit Note   Patient: Crystal Merritt           Date of Birth: 1970-07-03           MRN: [id number] Visit Date: 06/01/2019              Requested by: Monico Blitz, MD [address] [place],   [id number] PCP: Monico Blitz, MD   Assessment & Plan: Visit Diagnoses:  1. Pain in right hand     Plan: Work slip given for work resumption on Monday which was patient's request.  We discussed with her that if she feels like the job is too physically demanding she can make plans for doing some other type of job that would fit her capabilities better.  She can follow-up with [place] on an as-needed basis.  Follow-Up Instructions: Return if symptoms worsen or fail to improve.   Orders:  No orders of the defined types were placed in this encounter.  No orders of the defined types were placed in this encounter.     Procedures: No procedures performed   Clinical Data: No additional findings.   Subjective: Chief Complaint  Patient presents with  . Right Thumb - Follow-up    HPI 49 year old female works at [employer] had a fall injured her right hand x-rays negative.  She worked a good year for 2 months where she does lifting up to 80 pounds and has a very strenuous job.  She seen 2 providers Dr. Durward Fortes and Dr. Luna Glasgow and I reviewed their notes.  She states she feels like she is ready to resume work on Monday but states her job is so physically demanding she feels like she is barely able to do it.  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  Review of Systems unchanged.   Objective: Vital Signs: Ht 5\' 5"  (1.651 m)   Wt 240 lb (108.9 kg)   BMI 39.94 kg/m   Physical Exam Constitutional:      Appearance: She is well-developed.  HENT:     Head: Normocephalic.     Right Ear: External ear normal.     Left Ear: External ear normal.  Eyes:     Pupils: Pupils are equal, round, and reactive to light.  Neck:     Thyroid: No  thyromegaly.     Trachea: No tracheal deviation.  Cardiovascular:     Rate and Rhythm: Normal rate.  Pulmonary:     Effort: Pulmonary effort is normal.  Abdominal:     Palpations: Abdomen is soft.  Skin:    General: Skin is warm and dry.  Neurological:     Mental Status: She is alert and oriented to person, place, and time.  Psychiatric:        Behavior: Behavior normal.     Ortho Exam full range of motion of the thumb no triggering.  Negative Finkelstein test negative [facility] grind test.  Sensation the fingertips is intact good flexion good grip.  Good wrist range of motion no scaphoid tenderness.  Specialty Comments:  No specialty comments available.  Imaging: No results found.   PMFS History: Patient Active Problem List   Diagnosis Date Noted  . Pain in right hand 05/17/2019  . Carpal tunnel syndrome 12/27/2018  . Essential hypertension, benign 12/27/2018  . Gastroesophageal reflux 12/27/2018  . Obesity, morbid ([place]) 12/27/2018  . Other and unspecified hyperlipidemia 12/27/2018  . Type II  or unspecified type diabetes mellitus without mention of complication, not stated as uncontrolled 12/27/2018  . Atypical facial pain 08/22/2015  . Chronic migraine 08/22/2015  . Hypertrophic and atrophic condition of skin 07/12/2008  . Signs and symptoms involving emotional state 03/07/2007  . 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 Contusion of knee 12/17/2006  . Abnormal mammogram 07/30/2006  . 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 Adjustment disorder with depressed mood 08/21/2005  . Anxiety 08/21/2005  . Anemia 04/30/2005  . Fatigue 04/30/2005  . Hypokalemia 04/30/2005   Past Medical History:  Diagnosis Date  . Anxiety   . Backache   . Chest pain   . Chronic neck pain   . CTS (carpal tunnel syndrome)   . Depressive disorder   . Fatigue   . Fibromyalgia   . GERD (gastroesophageal reflux disease)   . Hyperlipidemia   . Hypersomnia   . Hypertension    . Migraine   . MODY 1, uncomplicated, uncontrolled   . Morbid obesity ([place])   . Paresthesia of arm   . Trigeminal neuralgia     Family History  Problem Relation Age of Onset  . Hypertension Mother        Deceased  . Diabetes Mother   . Heart disease Brother   . Diabetes Father        Deceased  . Diabetes Brother   . Kidney failure Brother        Deceased    Past Surgical History:  Procedure Laterality Date  . PARTIAL HYSTERECTOMY     Social History   Occupational History  . Occupation: call center  Tobacco Use  . Smoking status: Former Research scientist (life sciences)  . Smokeless tobacco: Never Used  Substance and Sexual Activity  . Alcohol use: Yes    Alcohol/week: 0.0 standard drinks    Comment: Rare  . Drug use: No  . Sexual activity: Not on file

## 2019-10-06 ENCOUNTER — Ambulatory Visit: Payer: Managed Care, Other (non HMO) | Admitting: Neurology

## 2020-02-24 ENCOUNTER — Other Ambulatory Visit: Payer: Self-pay | Admitting: Neurology

## 2020-03-11 ENCOUNTER — Ambulatory Visit (INDEPENDENT_AMBULATORY_CARE_PROVIDER_SITE_OTHER): Payer: BC Managed Care – PPO | Admitting: Neurology

## 2020-03-11 ENCOUNTER — Other Ambulatory Visit: Payer: Self-pay

## 2020-03-11 ENCOUNTER — Encounter: Payer: Self-pay | Admitting: Neurology

## 2020-03-11 VITALS — BP 158/84 | HR 81 | Ht 65.0 in | Wt 242.0 lb

## 2020-03-11 DIAGNOSIS — R519 Headache, unspecified: Secondary | ICD-10-CM

## 2020-03-11 DIAGNOSIS — G932 Benign intracranial hypertension: Secondary | ICD-10-CM | POA: Diagnosis not present

## 2020-03-11 MED ORDER — ACETAZOLAMIDE 250 MG PO TABS: 500.0000 mg | ORAL_TABLET | Freq: Two times a day (BID) | ORAL | 3 refills | Status: DC

## 2020-03-11 MED ORDER — CARBAMAZEPINE ER 100 MG PO TB12: 100.0000 mg | ORAL_TABLET | Freq: Two times a day (BID) | ORAL | 3 refills | Status: DC

## 2020-03-11 NOTE — Progress Notes (Signed)
Follow-up Visit   Date: 84/53/64    Crystal Merritt MRN: [id number] DOB: Aug 12, 1970   Interim History: Crystal Merritt is a 50 y.o. 50 y.o. right-handed [place] female with hypertension, migraines, fibromyalgia, diabetes mellitus, GERD, depression and anxiety returning to the clinic for follow-up of migraines and pseudotumor cerebri.    History of present illness: Since around ~2010, she began having pressure over the right cheek which was initially treated for sinusitis.  Symptoms would come and go, but for the past year, the pain become more constant.  Pain is always located on the over the right cheek, described as a tooth ache. She does not have electrical shock-like, stabbing, or shooting pain.    She also has long history of migraines since being a teenager.  She reports having daily headaches previously and lasting all day. Triggers include stress, bright lights, and smell of smoke.  She has tried imitrex and topiramate without any relief.  She was started on carbmazepine 100mg  daily two weeks ago and has noticed 25% improvement.  Since taking carbmazpeine, headaches now occur about twice per week and are less intense.  Her facial pain has resolved on carbamazepine to 100mg  twice daily.  In the spring of 2018, she taper carbamazepine to 100mg  daily and several months later, started having worsening pain.   MRI brain which showed prominent optic nerve sheaths and was evaluated for intracranial hypertension with LP.  Opening pressure was 21cm H20 and CSF glucose was elevated 114.  Patient had been off her diabetic medication for several months. Although her pressures are only mildly elevated, she continued to have retroorbital pain and was started on diamox 250mg  twice daily, which initially helped her headaches.  Dr. Mitzi Hansen with [facility] dated 06/17/2016. 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 Left eye is normal. Visual field  testing was normal.  UPDATE 03/11/2020:  She is here for follow-up.  Her headaches were doing very well, occurring about 1-2 times per month, however, with the recent cooler temperature, headaches have been occurring 1-2 times per week.  She takes NSAIDs which helps.  She denies right facial pain, vision changes, numbness/tingling, or weakness.   Medications:  Current Outpatient Medications on File Prior to Visit  Medication Sig Dispense Refill  . acetaZOLAMIDE (DIAMOX) 250 MG tablet Take 2 tablets (500 mg total) by mouth 2 (two) times daily. 360 tablet 3  . amLODipine (NORVASC) 10 MG tablet Take 5 mg by mouth daily.     Marland Kitchen aspirin 81 MG tablet Take 81 mg by mouth daily.    Marland Kitchen atorvastatin (LIPITOR) 40 MG tablet Take 40 mg by mouth daily.    . carbamazepine (TEGRETOL XR) 100 MG 12 hr tablet Take 1 tablet (100 mg total) by mouth 2 (two) times daily. 825 tablet 3  . folic acid (FOLVITE) 1 MG tablet Take 1 mg by mouth daily.    Marland Kitchen glipiZIDE (GLUCOTROL) 10 MG tablet     . ibuprofen (ADVIL) 800 MG tablet Take 800 mg by mouth every 8 (eight) hours as needed.    Marland Kitchen JARDIANCE 25 MG TABS tablet Take 10 mg by mouth daily.    Marland Kitchen losartan-hydrochlorothiazide (HYZAAR) 100-25 MG tablet Take 1 tablet by mouth daily.    . methocarbamol (ROBAXIN) 500 MG tablet Take 500 mg by mouth every 6 (six) hours as needed for muscle spasms.    . ondansetron (ZOFRAN) 8 MG tablet Take by mouth every 8 (  eight) hours as needed for nausea or vomiting.    . pantoprazole (PROTONIX) 40 MG tablet Take 40 mg by mouth daily.    . promethazine (PHENERGAN) 25 MG tablet Take 25 mg by mouth every 6 (six) hours as needed for nausea or vomiting.     No current facility-administered medications on file prior to visit.    Allergies: No Known Allergies  Vital Signs:  BP (!) 158/84   Pulse 81   Ht 5\' 5"  (1.651 m)   Wt 242 lb (109.8 kg)   SpO2 100%   BMI 40.27 kg/m    Neurological Exam: MENTAL STATUS including orientation to time,  place, person, recent and remote memory, attention span and concentration is normal.  CRANIAL NERVES: No papilledema on funduscopic exam.  No visual field defects.  Pupils equal round and reactive to light.  Normal conjugate, extra-ocular eye movements in all directions of gaze.  No ptosis. Face sensation intact. Face is symmetric.   MOTOR:  Motor strength is 5/5 in all extremities.    REFLEXES: Reflexes are 2+/4 throughout.  SENSORY: Vibration is intact throughout.  COORDINATION/GAIT:  Gait narrow based and stable. Stressed gait intact.    Data: Labs 02/01/2015:  HbA1c 6.9 Labs 09/22/2014:  TSH 2.030, vitamin B12 589 Lab Results  Component Value Date   TSH 2.29 03/13/2016   Lab Results  Component Value Date   EPPIRJJO84 166 03/13/2016    CSF 05/28/2016: R37 W1  G114 P24  OP 21cmH20  MRI brain 05/19/2016: Prominent optic nerve sheaths, possible flattening of the optic papillae, could represent a manifestation of idiopathic intracranial hypertension. Correlate clinically as a cause of headaches. Lumbar puncture could be helpful in further evaluation. Otherwise unremarkable cranial MRI. No cause seen for LEFT hemisensory defect.  IMPRESSION/PLAN: 1.  Pseudotumor cerebri - very mild, but with prominent optic nerve sheaths.  No papilledema on exam today.    - Continue Diamox 500mg  twice daily  2.  Chronic migraines with right facial paresthesias.  Recent worsening of headache in the setting or barometric changes, occurring 1-2 weekly  - Continue carbamazepine 100mg  BID  Return to clinic in 1 year  Thank you for allowing me to participate in patient's care.  If I can answer any additional questions, I would be pleased to do so.    Sincerely,    Anissia Wessells K. Posey Pronto, DO

## 2020-03-11 NOTE — Patient Instructions (Signed)
Continue your medications as you are taking  Return to clinic in 1 year

## 2020-03-13 ENCOUNTER — Encounter: Payer: Self-pay | Admitting: Neurology

## 2020-03-14 ENCOUNTER — Encounter: Payer: Self-pay | Admitting: Neurology

## 2020-07-03 ENCOUNTER — Encounter: Payer: Self-pay | Admitting: Internal Medicine

## 2020-07-18 ENCOUNTER — Ambulatory Visit: Payer: BC Managed Care – PPO | Admitting: Orthopaedic Surgery

## 2020-07-25 ENCOUNTER — Ambulatory Visit: Payer: BC Managed Care – PPO | Admitting: Orthopaedic Surgery

## 2020-08-12 ENCOUNTER — Telehealth: Payer: Self-pay | Admitting: Neurology

## 2020-08-12 NOTE — Telephone Encounter (Signed)
Spoken to patient and notified Dr Serita Grit comments. Verbalized understanding. Patient will call back later to schedule a follow up appointment.

## 2020-08-12 NOTE — Telephone Encounter (Signed)
No, pseudotumor cerebri does not cause memory problems.  If she would like memory evaluation, please ask her to schedule follow-up. Thanks.

## 2020-08-12 NOTE — Telephone Encounter (Signed)
Does her Dx cause memory loss?

## 2020-08-12 NOTE — Telephone Encounter (Signed)
Pt called in and is worried about her short term memory. She said she is having issues with long term just short term. She started a new job and is having problems remembering things and its affected her job. She would like a call [phone]

## 2020-08-22 ENCOUNTER — Other Ambulatory Visit: Payer: Self-pay

## 2020-08-22 ENCOUNTER — Other Ambulatory Visit (INDEPENDENT_AMBULATORY_CARE_PROVIDER_SITE_OTHER): Payer: BC Managed Care – PPO

## 2020-08-22 ENCOUNTER — Encounter: Payer: Self-pay | Admitting: Physician Assistant

## 2020-08-22 ENCOUNTER — Ambulatory Visit (INDEPENDENT_AMBULATORY_CARE_PROVIDER_SITE_OTHER): Payer: BC Managed Care – PPO | Admitting: Physician Assistant

## 2020-08-22 VITALS — BP 134/83 | HR 63 | Resp 18 | Ht 65.0 in | Wt 235.0 lb

## 2020-08-22 DIAGNOSIS — R413 Other amnesia: Secondary | ICD-10-CM | POA: Diagnosis not present

## 2020-08-22 LAB — VITAMIN B12: Vitamin B-12: 622 pg/mL (ref 211–911)

## 2020-08-22 LAB — FOLATE: Folate: >24.4 ng/mL (ref >5.9)

## 2020-08-22 LAB — TSH: TSH: 2 u[IU]/mL (ref 0.35–5.50)

## 2020-08-22 LAB — FERRITIN: Ferritin: 130.4 ng/mL (ref 10.0–291.0)

## 2020-08-22 NOTE — Progress Notes (Addendum)
Assessment/Plan:   Crystal Merritt is a 50 y.o. 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 Posey Pronto, hyperlipidemia, fibromyalgia, obesity, chronic migraines with R facial paresthesias, chronic back pain, DM2, anxiety, depression and anemia, seen today for evaluation of memory loss.  MoCA today is 19/30 with deficiencies in visuospatial/executive, memory, attention, language, abstraction, delayed recall  2/5, orientation  5/6    Recommendations:   Memory Loss   MRI brain with/without contrast to assess for underlying structural abnormality and assess vascular load. Neurocognitive testing to further evaluate cognitive concerns and determine underlying cause of memory changes, including potential contribution from sleep, anxiety, or depression  Check B12, TSH, ferritin, folate EEG to evaluate for seizure activity Discussed safety both in and out of the home.  Discussed the importance of regular daily schedule with inclusion of crossword puzzles to maintain brain function.  Continue to monitor mood with PCP.  Stay active at least 30 minutes at least 3 times a week.  Naps should be scheduled and should be no longer than 60 minutes and should not occur after 2 PM.  Mediterranean diet is recommended  Folllow up once results above are available   Subjective:    The patient is seen in neurologic consultation at the request of Hasanaj, Samul Dada, MD for the evaluation of memory.  The patient is accompanied by husband who supplements the history. She is a  50 y.o. year old RH  female who has had memory issues for about 4 years since moving from [place].  She reports her attention span being "very short ".  In fact, during a insurance remote work interview, she was unable to complete the task, because she could not concentrate, not passing the test, unable to get a job.  At times, she has moments in which  she is "zoning out "while driving, where she can hear people talking, but she continues driving, unable to answer.  She decided to hold on driving, afraid of getting into an accident.  Until recently she was working, but she had to quit due to a frozen shoulder, unable to bear weight.   The patient reports that her mood is "okay, although there are times in which may be feeling down ".  She denies any irritability.  She tries to keep "the brain busy with crossword puzzles, reading and games".  She does not sleep well, because she has a history of OSA, and she is waiting for her CPAP "it is taking a long time".  She has vivid dreams, at times acting out, they feel very real to her, describing it as "living in the movie when I get to act ".  She denies any hallucinations or paranoia.  She denies sleepwalking but when she does wake up at night after the dream, "the wheel starts to turn ".  Frequently, she forgets her medications, as well as paying bills.  She denies leaving objects in unusual places.  Her appetite is good and denies trouble swallowing.  She cooks, and at times, she found herself leaving the stove on.  She denies forgetting any common recipes.  She ambulates without difficulty without the use of a walker or a cane.  She does have sciatic pain, which causes numbness in the groin /pudendal area, and is going to see a neurosurgeon.  Because of these, she has also some issues with incontinence because "by the time I feel that  they have to go, I have to go ".  She denies any constipation or diarrhea. The patient has a history of headaches, last one was 1 week ago, and they come about once a month, and the abortive headache medicine seems to help.  She denies any recent falls.  She had a couple of head injuries many years ago, one as a child, running into a steel flower pot, hitting the left frontal area with a loss of consciousness.  She had another injury in years ago hitting her head at the corner of the  table, without loss of consciousness.  She denies double vision, dizziness, unilateral weakness or tremors. Denies anosmia. Denies history of   ETOH or tobacco.  History remarkable for ADD son, and bipolar disease in her daughter.  She is not sure about mother and father health history  Most recent labs 08/02/20   Hb 11.3 HCT 36.5  MCV 71.2 (L)     Allergies  Allergen Reactions   Diclofenac Sodium     Current Outpatient Medications  Medication Instructions   acetaZOLAMIDE (DIAMOX) 500 mg, Oral, 2 times daily   amLODipine (NORVASC) 10 MG tablet Oral   amLODipine (NORVASC) 5 mg, Oral, Daily   aspirin 81 mg, Oral, Daily   atorvastatin (LIPITOR) 40 MG tablet Oral   atorvastatin (LIPITOR) 40 mg, Oral, Daily   baclofen (LIORESAL) 10 mg, Oral, 3 times daily   carbamazepine (TEGRETOL XR) 100 mg, Oral, 2 times daily   folic acid (FOLVITE) 1 mg, Oral, Daily   glipiZIDE (GLUCOTROL) 10 MG tablet No dose, route, or frequency recorded.   ibuprofen (ADVIL) 800 mg, Oral, Every 8 hours PRN   Jardiance 10 mg, Oral, Daily   losartan-hydrochlorothiazide (HYZAAR) 100-25 MG tablet 1 tablet, Oral, Daily   methocarbamol (ROBAXIN) 500 mg, Oral, Every 6 hours PRN   ondansetron (ZOFRAN) 8 MG tablet Oral, Every 8 hours PRN   pantoprazole (PROTONIX) 40 mg, Oral, Daily   promethazine (PHENERGAN) 25 mg, Oral, Every 6 hours PRN     VITALS:   Vitals:   08/22/20 0757  BP: 134/83  Pulse: 63  Resp: 18  SpO2: 99%  Weight: 235 lb (106.6 kg)  Height: '5\' 5"'$  (1.651 m)   No flowsheet data found.  PHYSICAL EXAM   HEENT:  Normocephalic, atraumatic. The mucous membranes are moist. The superficial temporal arteries are without ropiness or tenderness. Cardiovascular: Regular rate and rhythm. Lungs: Clear to auscultation bilaterally. Neck: There are no carotid bruits noted bilaterally.  NEUROLOGICAL: Montreal Cognitive Assessment  08/22/2020  Visuospatial/ Executive (0/5) 4  Naming (0/3) 3  Attention: Read  list of digits (0/2) 2  Attention: Read list of letters (0/1) 1  Attention: Serial 7 subtraction starting at 100 (0/3) 0  Language: Repeat phrase (0/2) 1  Language : Fluency (0/1) 0  Abstraction (0/2) 1  Delayed Recall (0/5) 2  Orientation (0/6) 5  Total 19  Adjusted Score (based on education) 19   No flowsheet data found.  No flowsheet data found.   Orientation:  Alert and oriented to person, place and time. No aphasia or dysarthria. Fund of knowledge is appropriate. Recent memory impaired and remote memory intact.  Attention and concentration are reduced  Able to name objects and repeat phrases. Delayed recall  2/5 (patient did not make effort) Cranial nerves: There is good facial symmetry. Extraocular muscles are intact and visual fields are full to confrontational testing. Speech is fluent and clear. Soft palate rises symmetrically and there is  no tongue deviation. Hearing is intact to conversational tone. Tone: Tone is good throughout. Sensation: Sensation is intact to light touch and pinprick throughout. Vibration is intact at the bilateral big toe.There is no extinction with double simultaneous stimulation. There is no sensory dermatomal level identified. Coordination: The patient has no difficulty with RAM's or FNF bilaterally. Normal finger to nose  Motor: Strength is 5/5 in the bilateral upper and lower extremities. There is no pronator drift. There are no fasciculations noted. DTR's: Deep tendon reflexes are 1/4 at the bilateral biceps, triceps, brachioradialis, patella and achilles.  Plantar responses are downgoing bilaterally. Gait and Station: The patient is able to ambulate without difficulty.The patient is able to heel toe walk without any difficulty.The patient is able to ambulate in a tandem fashion. The patient is able to stand in the Romberg position. No foot drop     Thank you for allowing [place] the opportunity to participate in the care of this nice patient. Please do not  hesitate to contact us for any questions or concerns.   Total time spent on today's visit was 60 minutes, including both face-to-face time and nonface-to-face time.  Time included that spent on review of records (prior notes available to me/labs/imaging if pertinent), discussing treatment and goals, answering patient's questions and coordinating care.  Cc:  Neale Burly, MD  Sharene Butters 08/22/2020 12:15 PM

## 2020-08-22 NOTE — Patient Instructions (Addendum)
It was a pleasure to see you today at our office.   Recommendations:  Neurocognitive evaluation at our office MRI of the brain, the radiology office will call you to arrange you appointment EEG to see brainwaves Check labs today Follow up once the results of the above are available   RECOMMENDATIONS FOR ALL PATIENTS WITH MEMORY PROBLEMS: 1. Continue to exercise (Recommend 30 minutes of walking everyday, or 3 hours every week) 2. Increase social interactions - continue going to [place] and enjoy social gatherings with friends and family 3. Eat healthy, avoid fried foods and eat more fruits and vegetables 4. Maintain adequate blood pressure, blood sugar, and blood cholesterol level. Reducing the risk of stroke and cardiovascular disease also helps promoting better memory. 5. Avoid stressful situations. Live a simple life and avoid aggravations. Organize your time and prepare for the next day in anticipation. 6. Sleep well, avoid any interruptions of sleep and avoid any distractions in the bedroom that may interfere with adequate sleep quality 7. Avoid sugar, avoid sweets as there is a strong link between excessive sugar intake, diabetes, and cognitive impairment We discussed the Mediterranean diet, which has been shown to help patients reduce the risk of progressive memory disorders and reduces cardiovascular risk. This includes eating fish, eat fruits and green leafy vegetables, nuts like almonds and hazelnuts, walnuts, and also use olive oil. Avoid fast foods and fried foods as much as possible. Avoid sweets and sugar as sugar use has been linked to worsening of memory function.  There is always a concern of gradual progression of memory problems. If this is the case, then we may need to adjust level of care according to patient needs. Support, both to the patient and caregiver, should then be put into place.      You have been referred for a neuropsychological evaluation (i.e., evaluation of  memory and thinking abilities). Please bring someone with you to this appointment if possible, as it is helpful for the doctor to hear from both you and another adult who knows you well. Please bring eyeglasses and hearing aids if you wear them.    The evaluation will take approximately 3 hours and has two parts:   The first part is a clinical interview with the neuropsychologist (Dr. Melvyn Novas or Dr. Nicole Kindred). During the interview, the neuropsychologist will speak with you and the individual you brought to the appointment.    The second part of the evaluation is testing with the doctor's technician Hinton Dyer or Maudie Mercury). During the testing, the technician will ask you to remember different types of material, solve problems, and answer some questionnaires. Your family member will not be present for this portion of the evaluation.   Please note: We must reserve several hours of the neuropsychologist's time and the psychometrician's time for your evaluation appointment. As such, there is a No-Show fee of $100. If you are unable to attend any of your appointments, please contact our office as soon as possible to reschedule.    FALL PRECAUTIONS: Be cautious when walking. Scan the area for obstacles that may increase the risk of trips and falls. When getting up in the mornings, sit up at the edge of the bed for a few minutes before getting out of bed. Consider elevating the bed at the head end to avoid drop of blood pressure when getting up. Walk always in a well-lit room (use night lights in the walls). Avoid area rugs or power cords from appliances in the middle of the  walkways. Use a walker or a cane if necessary and consider physical therapy for balance exercise. Get your eyesight checked regularly.  FINANCIAL OVERSIGHT: Supervision, especially oversight when making financial decisions or transactions is also recommended.  HOME SAFETY: Consider the safety of the kitchen when operating appliances like stoves,  microwave oven, and blender. Consider having supervision and share cooking responsibilities until no longer able to participate in those. Accidents with firearms and other hazards in the house should be identified and addressed as well.   ABILITY TO BE LEFT ALONE: If patient is unable to contact 911 operator, consider using LifeLine, or when the need is there, arrange for someone to stay with patients. Smoking is a fire hazard, consider supervision or cessation. Risk of wandering should be assessed by caregiver and if detected at any point, supervision and safe proof recommendations should be instituted.  MEDICATION SUPERVISION: Inability to self-administer medication needs to be constantly addressed. Implement a mechanism to ensure safe administration of the medications.   DRIVING: Regarding driving, in patients with progressive memory problems, driving will be impaired. We advise to have someone else do the driving if trouble finding directions or if minor accidents are reported. Independent driving assessment is available to determine safety of driving.   If you are interested in the driving assessment, you can contact the following:  [employer] in [place]  [place] [phone] or [phone]    [place]illian refers to food and lifestyle choices that are based on the traditions of countries located on the [employer]. This way of eating has been shown to help prevent certain conditions and improve outcomes for people who have chronic diseases, like kidney disease and heart disease. What are tips for following this plan? Lifestyle  Cook and eat meals together with your family, when possible. Drink enough fluid to keep your urine clear or pale yellow. Be physically active every day. This includes: Aerobic exercise like running or  swimming. Leisure activities like gardening, walking, or housework. Get 7-8 hours of sleep each night. If recommended by your health care provider, drink red wine in moderation. This means 1 glass a day for nonpregnant women and 2 glasses a day for men. A glass of wine equals 5 oz (150 mL). Reading food labels  Check the serving size of packaged foods. For foods such as rice and pasta, the serving size refers to the amount of cooked product, not dry. Check the total fat in packaged foods. Avoid foods that have saturated fat or trans fats. Check the ingredients list for added sugars, such as corn syrup. Shopping  At the grocery store, buy most of your food from the areas near the walls of the store. This includes: Fresh fruits and vegetables (produce). Grains, beans, nuts, and seeds. Some of these may be available in unpackaged forms or large amounts (in bulk). Fresh seafood. Poultry and eggs. Low-fat dairy products. Buy whole ingredients instead of prepackaged foods. Buy fresh fruits and vegetables in-season from local farmers markets. Buy frozen fruits and vegetables in resealable bags. If you do not have access to quality fresh seafood, buy precooked frozen shrimp or canned fish, such as tuna, salmon, or sardines. Buy small amounts of raw or cooked vegetables, salads, or olives from the deli or salad bar at your store. Stock your pantry so you always have certain foods on hand, such as olive oil, canned tuna, canned tomatoes,  rice, pasta, and beans. Cooking  Cook foods with extra-virgin olive oil instead of using butter or other vegetable oils. Have meat as a side dish, and have vegetables or grains as your main dish. This means having meat in small portions or adding small amounts of meat to foods like pasta or stew. Use beans or vegetables instead of meat in common dishes like chili or lasagna. Experiment with different cooking methods. Try roasting or broiling vegetables instead of  steaming or sauteing them. Add frozen vegetables to soups, stews, pasta, or rice. Add nuts or seeds for added healthy fat at each meal. You can add these to yogurt, salads, or vegetable dishes. Marinate fish or vegetables using olive oil, lemon juice, garlic, and fresh herbs. Meal planning  Plan to eat 1 vegetarian meal one day each week. Try to work up to 2 vegetarian meals, if possible. Eat seafood 2 or more times a week. Have healthy snacks readily available, such as: Vegetable sticks with hummus. Greek yogurt. Fruit and nut trail mix. Eat balanced meals throughout the week. This includes: Fruit: 2-3 servings a day Vegetables: 4-5 servings a day Low-fat dairy: 2 servings a day Fish, poultry, or lean meat: 1 serving a day Beans and legumes: 2 or more servings a week Nuts and seeds: 1-2 servings a day Whole grains: 6-8 servings a day Extra-virgin olive oil: 3-4 servings a day Limit red meat and sweets to only a few servings a month What are my food choices? Mediterranean diet Recommended Grains: Whole-grain pasta. Brown rice. Bulgar wheat. Polenta. Couscous. Whole-wheat bread. [place]. Vegetables: Artichokes. Beets. Broccoli. Cabbage. Carrots. Eggplant. Green beans. Chard. Kale. Spinach. Onions. Leeks. Peas. Squash. Tomatoes. Peppers. Radishes. Fruits: Apples. Apricots. Avocado. Berries. Bananas. Cherries. Dates. Figs. Grapes. Lemons. Melon. Oranges. Peaches. Plums. Pomegranate. Meats and other protein foods: Beans. Almonds. Sunflower seeds. Pine nuts. Peanuts. [place]. Salmon. Scallops. Shrimp. [place]. Tilapia. Clams. Oysters. Eggs. Dairy: Low-fat milk. Cheese. Greek yogurt. Beverages: Water. Red wine. Herbal tea. Fats and oils: Extra virgin olive oil. Avocado oil. Grape seed oil. Sweets and desserts: [place] yogurt with honey. Baked apples. Poached pears. Trail mix. Seasoning and other foods: Basil. Cilantro. Coriander. Cumin. Mint. Parsley. Sage. Rosemary. Tarragon. Garlic.  Oregano. Thyme. Pepper. Balsalmic vinegar. Tahini. Hummus. Tomato sauce. Olives. Mushrooms. Limit these Grains: Prepackaged pasta or rice dishes. Prepackaged cereal with added sugar. Vegetables: Deep fried potatoes (french fries). Fruits: Fruit canned in syrup. Meats and other protein foods: Beef. Pork. Lamb. Poultry with skin. Hot dogs. Berniece Salines. Dairy: Ice cream. Sour cream. Whole milk. Beverages: Juice. Sugar-sweetened soft drinks. Beer. Liquor and spirits. Fats and oils: Butter. Canola oil. Vegetable oil. Beef fat (tallow). Lard. Sweets and desserts: Cookies. Cakes. Pies. Candy. Seasoning and other foods: Mayonnaise. Premade sauces and marinades. The items listed may not be a complete list. Talk with your dietitian about what dietary choices are right for you. Summary The Mediterranean diet includes both food and lifestyle choices. Eat a variety of fresh fruits and vegetables, beans, nuts, seeds, and whole grains. Limit the amount of red meat and sweets that you eat. Talk with your health care provider about whether it is safe for you to drink red wine in moderation. This means 1 glass a day for nonpregnant women and 2 glasses a day for men. A glass of wine equals 5 oz (150 mL). This information is not intended to replace advice given to you by your health care provider. Make sure you discuss any questions you have with your health  care provider. Document Released: 08/22/2015 Document Revised: 09/24/2015 Document Reviewed: 08/22/2015 Elsevier Interactive Patient Education  2017 [employer].

## 2020-08-23 NOTE — Progress Notes (Signed)
Patient advised of labs, voiced understanding.

## 2020-09-05 ENCOUNTER — Ambulatory Visit (INDEPENDENT_AMBULATORY_CARE_PROVIDER_SITE_OTHER): Payer: BC Managed Care – PPO | Admitting: Neurology

## 2020-09-05 ENCOUNTER — Telehealth: Payer: Self-pay

## 2020-09-05 ENCOUNTER — Other Ambulatory Visit: Payer: Self-pay

## 2020-09-05 DIAGNOSIS — R413 Other amnesia: Secondary | ICD-10-CM | POA: Diagnosis not present

## 2020-09-05 DIAGNOSIS — R519 Headache, unspecified: Secondary | ICD-10-CM

## 2020-09-05 DIAGNOSIS — S39012A Strain of muscle, fascia and tendon of lower back, initial encounter: Secondary | ICD-10-CM

## 2020-09-05 DIAGNOSIS — G932 Benign intracranial hypertension: Secondary | ICD-10-CM

## 2020-09-05 NOTE — Procedures (Signed)
ELECTROENCEPHALOGRAM REPORT  Date of Study: 09/05/2020  Patient's Name: Crystal Merritt MRN: [id number] Date of Birth: March 03, 1970  Referring Provider: Sharene Butters, PA-C  Clinical History:  This is a 50 year old woman with memory loss, zoning out. EEG for classification.  Medications: DIAMOX  500 mg NORVASC 10 MG tablet NORVASC  5 MG tablet LIPITOR) 40 MG tablet LIORESAL TEGRETOL XR  100 mg FOLVITE GLUCOTROL 10 MG tablet HYZAAR 100-25 MG tablet ADVIL ROBAXIN  500 mg ZOFRAN 8 MG tablet Jardiance PROTONIX  40 mg   PHENERGAN   25 mg  Technical Summary: A multichannel digital EEG recording measured by the international 10-20 system with electrodes applied with paste and impedances below 5000 ohms performed in our laboratory with EKG monitoring in an awake and asleep patient.  Hyperventilation was not performed. Photic stimulation was performed.  The digital EEG was referentially recorded, reformatted, and digitally filtered in a variety of bipolar and referential montages for optimal display.    Description: The patient is awake and asleep during the recording.  During maximal wakefulness, there is a symmetric, medium voltage 9 Hz posterior dominant rhythm that attenuates with eye opening.  The record is symmetric.  During drowsiness and sleep, there is an increase in theta slowing of the background.  Vertex waves and symmetric sleep spindles were seen.  Photic stimulation did not elicit any abnormalities.  There were no epileptiform discharges or electrographic seizures seen.    EKG lead was unremarkable.  Impression: This awake and asleep EEG is normal.    Clinical Correlation: A normal EEG does not exclude a clinical diagnosis of epilepsy.  If further clinical questions remain, prolonged EEG may be helpful.  Clinical correlation is advised.   Ellouise Newer, M.D.

## 2020-09-05 NOTE — Telephone Encounter (Signed)
-----   Message from Cameron Sprang, MD sent at 09/05/2020 12:04 PM EDT ----- Pls let her know the EEG is normal, however it is not like a pregnancy test that is positive or negative, it is just snapshot of her brain waves. We would like to do a 48-hour EEG to try to capture the zoning out episodes and see what the brain waves show during them. If she is agreeable, pls order 48-hour EEG and let Manuela Schwartz know, thanks

## 2020-09-05 NOTE — Telephone Encounter (Signed)
Pt called and informed that EEG is normal, however it is not like a pregnancy test that is positive or negative, it is just snapshot of her brain waves. We would like to do a 48-hour EEG to try to capture the zoning out episodes and see what the brain waves show during them. 48 hour eeg ordered

## 2020-09-13 ENCOUNTER — Encounter: Payer: Self-pay | Admitting: Psychology

## 2020-09-13 ENCOUNTER — Ambulatory Visit (INDEPENDENT_AMBULATORY_CARE_PROVIDER_SITE_OTHER): Payer: BC Managed Care – PPO | Admitting: Psychology

## 2020-09-13 ENCOUNTER — Ambulatory Visit: Payer: BC Managed Care – PPO | Admitting: Psychology

## 2020-09-13 ENCOUNTER — Other Ambulatory Visit: Payer: Self-pay

## 2020-09-13 DIAGNOSIS — R4189 Other symptoms and signs involving cognitive functions and awareness: Secondary | ICD-10-CM | POA: Diagnosis not present

## 2020-09-13 DIAGNOSIS — G43919 Migraine, unspecified, intractable, without status migrainosus: Secondary | ICD-10-CM

## 2020-09-13 DIAGNOSIS — G4733 Obstructive sleep apnea (adult) (pediatric): Secondary | ICD-10-CM | POA: Diagnosis not present

## 2020-09-13 DIAGNOSIS — G932 Benign intracranial hypertension: Secondary | ICD-10-CM | POA: Insufficient documentation

## 2020-09-13 DIAGNOSIS — M797 Fibromyalgia: Secondary | ICD-10-CM

## 2020-09-13 NOTE — Progress Notes (Signed)
NEUROPSYCHOLOGICAL EVALUATION [place]. [place] Department of Neurology  Date of Evaluation: September 13, 2020  Reason for Referral:   Crystal Merritt is a 50 y.o. right-handed African-American female referred by  Crystal Butters, PA-C , to characterize her current cognitive functioning and assist with diagnostic clarity and treatment planning in the context of subjective cognitive decline.   Assessment and Plan:   Clinical Impression(s): Crystal Merritt pattern of performance is suggestive of neuropsychological functioning largely within normal limits relative to age-matched peers. She did demonstrate a relative weakness and/or variability across processing speed and verbal fluency. However, scores were broadly adequate and no consistent impairments emerged. Performance was appropriate across attention/concentration, executive functioning, receptive language, confrontation naming, visuospatial abilities, and learning and memory. Crystal Merritt denied difficulties completing instrumental activities of daily living (ADLs) independently.  Across questionnaires, Crystal Merritt reported severe sleep dysfunction. This mirrors her report during interview which she reported only obtaining about four hours of nightly sleep, often broken due to notable trouble staying asleep. She acknowledged a history of sleep apnea but has had an extremely difficult time getting her CPAP machine due to reported shipping delays. As such, this condition remains untreated at the present time. It is quite reasonable to believe that current subjective deficits are directly related to notable sleep dysfunction as untreated or poorly managed sleep apnea has certainly been linked to mild cognitive dysfunction. This could account for her report of trouble staying focused and feeling inattentive as objective testing did not reveal any atypical performances surrounding attention/concentration as a whole. Additionally,  fibromyalgia and other chronic pain, regular migraine headaches, and various cardiovascular medical conditions will also influence cognitive abilities, generally to a mild extent. Overall, it is likely that the combination of these factors is responsible for subjective dysfunction. Continued medical monitoring will be important moving forward.   Crystal Merritt is quite young to strongly consider a neurodegenerative condition. Specific to memory, she was able to learn novel verbal and visual information efficiently and retain this knowledge after lengthy delays. Overall, memory performance combined with intact performances across other areas of cognitive functioning is not suggestive of early-onset Alzheimer's disease. Likewise, her cognitive and behavioral profile is not suggestive of any other form of neurodegenerative illness presently.  Recommendations: If not already done, Crystal Merritt should be in regular contact with her medical team to try and expedite the delivery of her CPAP machine. Untreated sleep apnea will create and worsen cognitive dysfunction. It will also increase her risk for stroke, heart attack, and dementia.   Crystal Merritt is encouraged to attend to lifestyle factors for brain health (e.g., regular physical exercise, good nutrition habits, regular participation in cognitively-stimulating activities, and general stress management techniques), which are likely to have benefits for both emotional adjustment and cognition. Optimal control of vascular risk factors (including safe cardiovascular exercise and adherence to dietary recommendations) is encouraged. Continued participation in activities which provide mental stimulation and social interaction is also recommended.   If interested, there are some activities which have therapeutic value and can be useful in keeping her cognitively stimulated. For suggestions, Crystal Merritt is encouraged to go to the following website:  https://www.[facility].org/get-to-know-barrow/centers-programs/neurorehabilitation-center/neuro-rehab-apps-and-games/ which has options, categorized by level of difficulty. It should be noted that these activities should not be viewed as a substitute for therapy.  When learning new information, she would benefit from information being broken up into small, manageable pieces. She may also find it helpful to articulate the material in her own words  and in a context to promote encoding at the onset of a new task. This material may need to be repeated multiple times to promote encoding.  Memory can be improved using internal strategies such as rehearsal, repetition, chunking, mnemonics, association, and imagery. External strategies such as written notes in a consistently used memory journal, visual and nonverbal auditory cues such as a calendar on the refrigerator or appointments with alarm, such as on a cell phone, can also help maximize recall.    To address problems with processing speed, she may wish to consider:   -Ensuring that she is alerted when essential material or instructions are being presented   -Adjusting the speed at which new information is presented   -Allowing for more time in comprehending, processing, and responding in conversation  To address problems with fluctuating attention, she may wish to consider:   -Avoiding external distractions when needing to concentrate   -Limiting exposure to fast paced environments with multiple sensory demands   -Writing down complicated information and using checklists   -Attempting and completing one task at a time (i.e., no multi-tasking)   -Verbalizing aloud each step of a task to maintain focus   -Taking frequent breaks during the completion of steps/tasks to avoid fatigue   -Reducing the amount of information considered at one time  Review of Records:   Ms. Crystal Merritt was seen by [facility] Narda Amber, D.O.) on 03/11/2020 for  follow-up of migraine headaches and pseudotumor cerebri. Briefly, since around 2010, she began having pressure over the right cheek and was initially treated for sinusitis. Symptoms would come and go, but for the past year, the pain became more constant. Pain is always located on the over the right cheek, described as a tooth ache. She does not have electrical shock-like, stabbing, or shooting pain. She also has long history of migraines since being a teenager. She reported having daily headaches lasting all day. Triggers include stress, bright lights, and the smell of smoke. She has tried Imitrex and topiramate without relief. She was started on carbamazepine '100mg'$  daily two weeks prior to her appointment with Dr. Posey Pronto and has noticed 25% improvement. Since taking carbamazepine, headaches would occur about twice per week and are less intense. Her facial pain has also resolved on carbamazepine. Of note, in the spring of 2018, she tapered carbamazepine to '100mg'$  daily and several months later, started having worsening pain. During her follow-up with Dr. Posey Pronto, Ms. Clyburn was noted to be doing well from a headache standpoint, with symptoms occurring about 1-2 times per month. However, recent weather/barometric changes were said to worsen symptoms and cause them to occur more frequently.   She followed up with [facility] Crystal Butters, PA-C) on 08/22/2020 for an evaluation of memory loss. At that time, she reported memory issues for the past four years. She described her attention span being "very short." She noted recently being unable to complete a task during a remote work interview due to difficulty with concentration; because of this, she was reportedly not hired. At times, she reported having moments in which she is "zoning out." She reported hearing people talking but does not respond. She described her mood as "okay," although there are times where she may feel down. She does not sleep well due to  obstructive sleep apnea. She has been waiting on the arrival of her CPAP machine for a long time. She denied any hallucinations, paranoia, or REM sleep behaviors. She did report occasional vivid dreaming. ADLs are generally  intact; however, there are instances where she forgets to take her medications. Performance on a brief cognitive screening instrument (MOCA) was 19/30. Ultimately, Ms. Maberry was referred for a comprehensive neuropsychological evaluation to characterize her cognitive abilities and to assist with diagnostic clarity and treatment planning.   Brain MRI on 05/19/2016 revealed prominent optic nerve sheaths and she was evaluated for intracranial hypertension with LP. Opening pressure was 21cm H20 and CSF glucose was elevated 114. EEG on 09/05/2020 was unremarkable. She was scheduled for a repeat brain MRI on 09/14/2020.   Past Medical History:  Diagnosis Date   Abnormal mammogram 07/30/2006   Anemia 04/30/2005   Atypical facial pain 08/22/2015   Back pain    Carpal tunnel syndrome 12/27/2018   Chest pain    Chronic migraine 08/22/2015   Chronic neck pain    Contusion of knee 12/17/2006   Essential hypertension, benign 12/27/2018   Fatigue    Fibromyalgia    Gastroesophageal reflux 12/27/2018   Generalized anxiety disorder 08/21/2005   Hyperlipidemia 12/27/2018   Hypertrophic and atrophic condition of skin 07/12/2008   Hypokalemia 04/30/2005   Major depressive disorder 08/21/2005   MODY 1, uncomplicated, uncontrolled    Morbid obesity    Obstructive sleep apnea    Pain in right hand 05/17/2019   Paresthesia of arm    Pseudotumor cerebri    Trigeminal neuralgia    Type II diabetes mellitus 12/27/2018    Past Surgical History:  Procedure Laterality Date   PARTIAL HYSTERECTOMY      Current Outpatient Medications:    gabapentin (NEURONTIN) 100 MG capsule, Take by mouth., Disp: , Rfl:    metFORMIN (GLUCOPHAGE) 500 MG tablet, Take by mouth., Disp: , Rfl:    pantoprazole  (PROTONIX) 40 MG tablet, Take by mouth., Disp: , Rfl:    predniSONE (DELTASONE) 20 MG tablet, 3 po daily for 2 days, then 2 po daily for 2 days, then 1 po daily for 2 days, Disp: , Rfl:    acetaZOLAMIDE (DIAMOX) 250 MG tablet, Take 2 tablets (500 mg total) by mouth 2 (two) times daily., Disp: 360 tablet, Rfl: 3   amLODipine (NORVASC) 10 MG tablet, Take 5 mg by mouth daily. , Disp: , Rfl:    amLODipine (NORVASC) 10 MG tablet, Take by mouth., Disp: , Rfl:    aspirin 81 MG tablet, Take 81 mg by mouth daily., Disp: , Rfl:    atorvastatin (LIPITOR) 40 MG tablet, Take 40 mg by mouth daily., Disp: , Rfl:    atorvastatin (LIPITOR) 40 MG tablet, Take by mouth., Disp: , Rfl:    baclofen (LIORESAL) 10 MG tablet, Take 10 mg by mouth 3 (three) times daily., Disp: , Rfl:    carbamazepine (TEGRETOL XR) 100 MG 12 hr tablet, Take 1 tablet (100 mg total) by mouth 2 (two) times daily., Disp: 180 tablet, Rfl: 3   folic acid (FOLVITE) 1 MG tablet, Take 1 mg by mouth daily., Disp: , Rfl:    glipiZIDE (GLUCOTROL) 10 MG tablet, , Disp: , Rfl:    ibuprofen (ADVIL) 800 MG tablet, Take 800 mg by mouth every 8 (eight) hours as needed., Disp: , Rfl:    JARDIANCE 25 MG TABS tablet, Take 10 mg by mouth daily., Disp: , Rfl:    losartan-hydrochlorothiazide (HYZAAR) 100-25 MG tablet, Take 1 tablet by mouth daily., Disp: , Rfl:    methocarbamol (ROBAXIN) 500 MG tablet, Take 500 mg by mouth every 6 (six) hours as needed for muscle spasms., Disp: ,  Rfl:    ondansetron (ZOFRAN) 8 MG tablet, Take by mouth every 8 (eight) hours as needed for nausea or vomiting., Disp: , Rfl:    ondansetron (ZOFRAN-ODT) 4 MG disintegrating tablet, Take 4 mg by mouth daily as needed., Disp: , Rfl:    pantoprazole (PROTONIX) 40 MG tablet, Take 40 mg by mouth daily., Disp: , Rfl:    promethazine (PHENERGAN) 25 MG tablet, Take 25 mg by mouth every 6 (six) hours as needed for nausea or vomiting., Disp: , Rfl:   Clinical Interview:   The following  information was obtained during a clinical interview with Ms. Maczko and her husband prior to cognitive testing.  Cognitive Symptoms: Decreased short-term memory: Endorsed. Examples included trouble recalling details of previous conversations, asking repetitive questions, and forgetting about upcoming appointments or tasks which need to be completed. She also noted that memory dysfunction has impacted her ability to work as she has trouble remembering various steps in job-related tasks or job-related training which she must complete. Memory dysfunction was said to be present for the past year or so and has gradually gotten worse. However, when meeting with Ms. Wertman last month, memory concerns were said to be present for the past four years.  Decreased long-term memory: Denied. Decreased attention/concentration: Endorsed. She reported prominent difficulty with sustained attention and distractibility. Her husband also noted instances where she will either hyperfocus on a task or seemingly zone out. She stated that when she zones out, she can hear other people speaking but is unable to respond for a brief time. Attentional dysfunction was said to be present for the past several years and seems much worse with concurrent headache symptoms. She did not endorse longstanding attentional dysregulation dating back to childhood/adolescence.  Reduced processing speed: Denied. Rather, she reported that her mind will often be racing to the extent that it is difficult to concentrate on what people are saying to her.  Difficulties with executive functions: Endorsed. Primary difficulties surrounded trouble with organization and indecision. She denied trouble with impulsivity and her husband did not report significant personality changes.  Difficulties with emotion regulation: Denied. Difficulties with receptive language: Denied. Difficulties with word finding: Endorsed. Difficulties were said to be present for the past  year or so but have worsened during the past few months.  Decreased visuoperceptual ability: Denied.  Difficulties completing ADLs: Largely denied. However, she did report occasional instances where she will forget to take her medications due to her getting distracted.   Additional Medical History: History of traumatic brain injury/concussion: Prior medical records suggest a few very remote head injuries. For example, she reported running into a steel flower pot as a child. She also reported getting hit in the head with a table while at work many years prior. During the current interview, she denied any instances where head impacts caused a brief loss in consciousness. However, she did note that they cased her to briefly "see stars." No persisting difficulties from these injuries were reported. History of stroke: Denied. History of seizure activity: Denied. History of known exposure to toxins: Denied. Symptoms of chronic pain: Endorsed. Medical records suggest a history of fibromyalgia, as well as pain localized to her neck, back, knee and right hand.  Experience of frequent headaches/migraines: Endorsed. Currently, migraine headaches were said to occur 1-2 times per week. She noted that medication is often effective; however, she sometimes must supplement this by adding Excedrin migraine. Symptoms are often debilitating and last throughout the day. They are also often exacerbated  by changes in the weather/atmospheric pressure. Frequent instances of dizziness/vertigo: Denied. However, she did report occasional dizziness when standing up or getting out of bed quickly.   Sensory changes: She wears glasses with benefit. Other sensory changes/difficulties (e.g., hearing, taste, or smell) were denied.  Balance/coordination difficulties: Endorsed. She reported her balance seeming "off" for the past three years. Some of these difficulties were attributed to ongoing back and knee pain. Other contributors were  unknown. She noted that one side of the body does not seem less stable or more weak than the other. She has had a few falls with the most recently occurring about a year ago. She reported going down steps and missed a few, ultimately causing her to fall.  Other motor difficulties: Largely denied. While she did not endorse any tremors, she did wonder if her hand strength had diminished. One side was not said to be worse than the other.  Sleep History: Estimated hours obtained each night: 4 hours.  Difficulties falling asleep: Denied. Difficulties staying asleep: Endorsed. She reported significant trouble staying asleep and that the sleep she is able to obtain on a nightly basis is very broken and non consistent.  Feels rested and refreshed upon awakening: Denied.  History of snoring: Endorsed. History of waking up gasping for air: Endorsed. Witnessed breath cessation while asleep: Endorsed. She acknowledged a history of obstructive sleep apnea. She has been awaiting the delivery of her CPAP machine for a long period of time. When asked, she did not know when this was coming, stating that it still could be several weeks due to shipping delays.   History of vivid dreaming: Endorsed. Excessive movement while asleep: Denied. Instances of acting out her dreams: Denied.  Psychiatric/Behavioral Health History: Depression: She reported a history of generally mild depressive symptoms. She does not appear to be prescribed a traditional anti-depressant medication at the present time. She described her current mood as "okay" and did not strongly endorse current depressive symptoms. Current or remote suicidal ideation, intent, or plan was denied.  Anxiety: She reported a longstanding history of generalized anxiety symptoms. She reported taking anti-anxiety medications as needed as opposed to a consistent daily medication. Her current prn medication was said to simply put her to sleep and she seemed unsure how  helpful it is in addressing anxiety symptoms. Mania: Denied. Trauma History: Denied. Visual/auditory hallucinations: Denied. Delusional thoughts: Denied.  Tobacco: Denied. Alcohol: She reported very rare, social alcohol consumption and denied a history of problematic alcohol abuse or dependence.  Recreational drugs: Denied.  Family History: Problem Relation Age of Onset   Hypertension Mother        Deceased   Diabetes Mother    Diabetes Father        Deceased   Heart disease Brother    Diabetes Brother    Kidney failure Brother        Deceased   Bipolar disorder Daughter        suspected but not formally diagnosed   ADD / ADHD Son    This information was confirmed by Ms. Hassell Done.  Academic/Vocational History: Highest level of educational attainment: 14 years. She graduated from high school and completed an additional two years of college. She described herself as a good (A/B) student in college. High school performances were worse, with her noting that she did not push herself as hard. Math was noted as a likely relative weakness.  History of developmental delay: Denied. History of grade repetition: Denied. Enrollment in special education  courses: Denied. History of LD/ADHD: Denied.  Employment: Unemployed. 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 She stated that she had a job interview recently but had notable trouble picking up new training and remembering complex multi-step processes. She also noted that she found the training and responsibilities to be overwhelming and anxiety inducing, leading to her not remaining employed.   Evaluation Results:   Behavioral Observations: Ms. Teters was accompanied by her husband, arrived to her appointment on time, and was appropriately dressed and groomed. She appeared alert and oriented. Observed gait and station were within normal limits. Gross motor functioning appeared intact upon informal observation and no abnormal movements  (e.g., tremors) were noted. Her affect was generally relaxed and positive, but did range appropriately given the subject being discussed during the clinical interview or the task at hand during testing procedures. She was soft-spoken but spontaneous speech was fluent and word finding difficulties were not observed during the clinical interview. Thought processes were coherent, organized, and normal in content. Insight into her cognitive difficulties appeared adequate. During testing, sustained attention was appropriate. Task engagement was adequate and she persisted when challenged. Overall, Ms. Worsham was cooperative with the clinical interview and subsequent testing procedures.   Adequacy of Effort: The validity of neuropsychological testing is limited by the extent to which the individual being tested may be assumed to have exerted adequate effort during testing. Ms. Estay expressed her intention to perform to the best of her abilities and exhibited adequate task engagement and persistence. Scores across stand-alone and embedded performance validity measures were within expectation. As such, the results of the current evaluation are believed to be a valid representation of Ms. Avetisyan's current cognitive functioning.  Test Results: Ms. Maria was fully oriented at the time of the current evaluation.  Intellectual abilities based upon educational and vocational attainment were estimated to be in the average range. Premorbid abilities were estimated to be within the below average range based upon a single-word reading test.   Processing speed was variable, ranging from the well below average to average normative ranges. Basic attention was average. More complex attention (e.g., working memory) was also average. Performance on a continuous performance task did not reveal any atypical scores and did not suggest an underlying attentional disorder such as ADHD. Executive functioning was average to well above  average.  While not directly assessed, receptive language abilities were believed to be intact. Likewise, Ms. Branon did not exhibit any difficulties comprehending task instructions and answered all questions asked of her appropriately. Assessed expressive language was variable. Verbal fluency (i.e., phonemic and semantic) was well below average to below average, while confrontation naming was average.     Assessed visuospatial/visuoconstructional abilities were mildly variably but overall appropriate, ranging from the below average to above average normative ranges.    Learning (i.e., encoding) of novel verbal and visual information was average to above average. Spontaneous delayed recall (i.e., retrieval) of previously learned information was below average to average. Retention rates were 103% across a story learning task, 63% across a list learning task, and 75% across a shape learning task. Performance across recognition tasks was average, suggesting evidence for information consolidation.   Results of emotional screening instruments suggested that recent symptoms of generalized anxiety were in the minimal range, while symptoms of depression were within normal limits. A screening instrument assessing recent sleep quality suggested the presence of severe sleep dysfunction. Across an childhood ADHD questionnaire, responses scored in the "unlikely to have ADHD" range across both  inattention and hyperactivity/impulsivity. Current symptoms suggested notable subjective trouble with inattention.   Tables of Scores:   Note: This summary of test scores accompanies the interpretive report and should not be considered in isolation without reference to the appropriate sections in the text. Descriptors are based on appropriate normative data and may be adjusted based on clinical judgment. Terms such as "Within Normal Limits" and "Outside Normal Limits" are used when a more specific description of the test score  cannot be determined.       Percentile - Normative Descriptor > 98 - Exceptionally High 91-97 - Well Above Average 75-90 - Above Average 25-74 - Average 9-24 - Below Average 2-8 - Well Below Average < 2 - Exceptionally Low       Validity:   DESCRIPTOR       ACS Word Choice: --- --- Within Normal Limits  Dot Counting Test: --- --- Within Normal Limits  NAB EVI: --- --- Within Normal Limits  D-KEFS Color Word Effort Index: --- --- Within Normal Limits       Orientation:      Raw Score Percentile   NAB Orientation, Form 1 29/29 --- ---       Cognitive Screening:      Raw Score Percentile   SLUMS: 27/30 --- ---       Intellectual Functioning:      Standard Score Percentile   Test of Premorbid Functioning: 88 21 Below Average       Memory:     NAB Memory Module, Form 1: Standard Score/ T Score Percentile   Total Memory Index 96 39 Average  List Learning       Total Trials 1-3 22/36 (44) 27 Average    List B 4/12 (45) 31 Average    Short Delay Free Recall 8/12 (50) 50 Average    Long Delay Free Recall 5/12 (37) 9 Below Average    Retention Percentage 63 (39) 14 Below Average    Recognition Discriminability 9 (51) 54 Average  Shape Learning       Total Trials 1-3 21/27 (61) 86 Above Average    Delayed Recall 6/9 (49) 46 Average    Retention Percentage 75 (41) 18 Below Average    Recognition Discriminability 8 (56) 73 Average  Story Learning       Immediate Recall 64/80 (49) 46 Average    Delayed Recall 35/40 (51) 54 Average    Retention Percentage 103 (56) 73 Average  Daily Living Memory       Immediate Recall 45/51 (52) 58 Average    Delayed Recall 14/17 (45) 31 Average    Retention Percentage 82 (42) 21 Below Average    Recognition Hits 9/10 (50) 50 Average       Attention/Executive Function:     Trail Making Test (TMT): Raw Score (T Score) Percentile     Part A 40 secs.,  0 errors (42) 21 Below Average    Part B 49 secs.,  0 errors (64) 92 Well Above Average          Scaled Score Percentile   WAIS-IV Coding: 9 37 Average       NAB Attention Module, Form 1: T Score Percentile     Digits Forward 55 69 Average    Digits Backwards 48 42 Average       Conners CPT 3: T Score Percentile     d' 57 75 High Average    Omissions 51 54 Average    Commissions  53 62 Average    Perseverations 59 82 High Average    HRT 52 58 Average    HRT SD 51 54 Average    Variability 59 82 High Average       D-KEFS Color-Word Interference Test: Raw Score (Scaled Score) Percentile     Color Naming 38 secs. (7) 16 Below Average    Word Reading 32 secs. (5) 5 Well Below Average    Inhibition 61 secs. (10) 50 Average      Total Errors 0 errors (12) 75 Above Average    Inhibition/Switching 61 secs. (12) 75 Above Average      Total Errors 0 errors (12) 75 Above Average       D-KEFS Verbal Fluency Test: Raw Score (Scaled Score) Percentile     Letter Total Correct 28 (7) 16 Below Average    Category Total Correct 25 (5) 5 Well Below Average    Category Switching Total Correct 11 (8) 25 Average    Category Switching Accuracy 10 (8) 25 Average      Total Set Loss Errors 1 (11) 63 Average      Total Repetition Errors 2 (11) 63 Average       Language:     Verbal Fluency Test: Raw Score (T Score) Percentile     Phonemic Fluency (FAS) 28 (41) 18 Below Average    Animal Fluency 12 (38) 12 Below Average        NAB Language Module, Form 1: T Score Percentile     Naming 31/31 (55) 69 Average       Visuospatial/Visuoconstruction:      Raw Score Percentile   Clock Drawing: 10/10 --- Within Normal Limits       NAB Spatial Module, Form 1: T Score Percentile     Figure Drawing Copy 57 75 Above Average        Scaled Score Percentile   WAIS-IV Block Design: 7 16 Below Average  WAIS-IV Matrix Reasoning: 7 16 Below Average       Mood and Personality:      Raw Score Percentile   Beck Depression Inventory - II: 10 --- Within Normal Limits  PROMIS Anxiety Questionnaire:  15 --- None to Slight       Additional Questionnaires:     Adult Self-Report Scale (Current): Raw Score Percentile     Inattention 32 --- ---    Hyperactive/Impulsive 12 --- ---  Adult Self-Report Scale (Childhood):       Inattention 0 --- Unlikely to have ADHD    Hyperactive/Impulsive 0 --- Unlikely to have ADHD       PROMIS Sleep Disturbance Questionnaire: 38 --- Severe   Informed Consent and Coding/Compliance:   The current evaluation represents a clinical evaluation for the purposes previously outlined by the referral source and is in no way reflective of a forensic evaluation.   Ms. Clemensen was provided with a verbal description of the nature and purpose of the present neuropsychological evaluation. Also reviewed were the foreseeable risks and/or discomforts and benefits of the procedure, limits of confidentiality, and mandatory reporting requirements of this provider. The patient was given the opportunity to ask questions and receive answers about the evaluation. Oral consent to participate was provided by the patient.   This evaluation was conducted by Christia Reading, Ph.D., licensed clinical neuropsychologist. Ms. Shiflett completed a clinical interview with Dr. Melvyn Novas, billed as one unit [phone], and 150 minutes of cognitive testing and scoring, billed as one unit [phone]  and four additional units [id number]. Psychometrist Milana Kidney, B.S., assisted Dr. Melvyn Novas with test administration and scoring procedures. As a separate and discrete service, Dr. Melvyn Novas spent a total of 160 minutes in interpretation and report writing billed as one unit [phone] and two units [id number].

## 2020-09-13 NOTE — Progress Notes (Signed)
   Psychometrician Note   Cognitive testing was administered to Lenora Boys by Milana Kidney, B.S. (psychometrist) under the supervision of Dr. Christia Reading, Ph.D., licensed psychologist on 09/13/20. Ms. Thibaut did not appear overtly distressed by the testing session per behavioral observation or responses across self-report questionnaires. Rest breaks were offered.    The battery of tests administered was selected by Dr. Christia Reading, Ph.D. with consideration to Ms. Toppin's current level of functioning, the nature of her symptoms, emotional and behavioral responses during interview, level of literacy, observed level of motivation/effort, and the nature of the referral question. This battery was communicated to the psychometrist. Communication between Dr. Christia Reading, Ph.D. and the psychometrist was ongoing throughout the evaluation and Dr. Christia Reading, Ph.D. was immediately accessible at all times. Dr. Christia Reading, Ph.D. provided supervision to the psychometrist on the date of this service to the extent necessary to assure the quality of all services provided.    CARMON AGREDANO will return within approximately 1-2 weeks for an interactive feedback session with Dr. Melvyn Novas at which time her test performances, clinical impressions, and treatment recommendations will be reviewed in detail. Ms. Kubina understands she can contact our office should she require our assistance before this time.  A total of 150 minutes of billable time were spent face-to-face with Ms. Onstad by the psychometrist. This includes both test administration and scoring time. Billing for these services is reflected in the clinical report generated by Dr. Christia Reading, Ph.D.  This note reflects time spent with the psychometrician and does not include test scores or any clinical interpretations made by Dr. Melvyn Novas. The full report will follow in a separate note.

## 2020-09-14 ENCOUNTER — Ambulatory Visit
Admission: RE | Admit: 2020-09-14 | Discharge: 2020-09-14 | Disposition: A | Discharge status: Home / Self Care | Payer: BC Managed Care – PPO | Source: Ambulatory Visit | Attending: Physician Assistant | Admitting: Physician Assistant

## 2020-09-14 MED ORDER — GADOBENATE DIMEGLUMINE 529 MG/ML IV SOLN
20.0000 mL | Freq: Once | INTRAVENOUS | Status: AC | PRN
  Administered 2020-09-14: 20 mL via INTRAVENOUS

## 2020-09-17 NOTE — Progress Notes (Signed)
Patient advised of MRI results, verbally understood results.

## 2020-09-20 ENCOUNTER — Ambulatory Visit (INDEPENDENT_AMBULATORY_CARE_PROVIDER_SITE_OTHER): Payer: BC Managed Care – PPO | Admitting: Psychology

## 2020-09-20 ENCOUNTER — Other Ambulatory Visit: Payer: Self-pay

## 2020-09-20 DIAGNOSIS — M797 Fibromyalgia: Secondary | ICD-10-CM | POA: Diagnosis not present

## 2020-09-20 DIAGNOSIS — G4733 Obstructive sleep apnea (adult) (pediatric): Secondary | ICD-10-CM | POA: Diagnosis not present

## 2020-09-20 DIAGNOSIS — R4189 Other symptoms and signs involving cognitive functions and awareness: Secondary | ICD-10-CM

## 2020-09-20 NOTE — Progress Notes (Signed)
   Neuropsychology Feedback Session [name] of [facility]  Reason for Referral:   Crystal Merritt is a 50 y.o. right-handed African-American female referred by  Sharene Butters, PA-C , to characterize her current cognitive functioning and assist with diagnostic clarity and treatment planning in the context of subjective cognitive decline.   Feedback:   Crystal Merritt completed a comprehensive neuropsychological evaluation on 09/13/2020. Please refer to that encounter for the full report and recommendations. Briefly, results suggested neuropsychological functioning largely within normal limits relative to age-matched peers. She did demonstrate a relative weakness and/or variability across processing speed and verbal fluency. However, scores were broadly adequate and no consistent impairments emerged. Across questionnaires, Crystal Merritt reported severe sleep dysfunction. It is quite reasonable to believe that current subjective deficits are directly related to notable sleep dysfunction as untreated or poorly managed sleep apnea has certainly been linked to mild cognitive dysfunction. This could account for her report of trouble staying focused and feeling inattentive as objective testing did not reveal any atypical performances surrounding attention/concentration as a whole. Additionally, fibromyalgia and other chronic pain, regular migraine headaches, and various cardiovascular medical conditions will also influence cognitive abilities, generally to a mild extent.  Crystal Merritt was accompanied by her husband during the current feedback session. Content of the current session focused on the results of her neuropsychological evaluation. Crystal Merritt was given the opportunity to ask questions and her questions were answered. She was encouraged to reach out should additional questions arise. A copy of her report was provided at the conclusion of the visit.      20 minutes  were spent conducting the current feedback session with Crystal Merritt, billed as one unit [phone].

## 2020-09-23 ENCOUNTER — Telehealth: Payer: Self-pay

## 2020-09-23 MED ORDER — CARBAMAZEPINE ER 100 MG PO TB12: 100.0000 mg | ORAL_TABLET | Freq: Every day | ORAL | 3 refills | Status: DC

## 2020-09-23 NOTE — Addendum Note (Signed)
Addended by: Jake Seats on: 09/23/2020 04:21 PM   Modules accepted: Orders

## 2020-09-23 NOTE — Telephone Encounter (Signed)
Pt called and informed Would do one medication change at a time. She can start by reducing the carbamazepine '100mg'$  to 1 tablet daily and update Dr. Posey Pronto in a week

## 2020-09-23 NOTE — Telephone Encounter (Signed)
Would do one medication change at a time. She can start by reducing the carbamazepine '100mg'$  to 1 tablet daily and update Dr. Posey Pronto in a week. Thanks

## 2020-09-23 NOTE — Telephone Encounter (Signed)
Pt called the access nurse and the call came to this nurse asking if she can start decreasing and come off her carbamazepine and Diamox due to "?? Memory loss??"  Pt has done testing with her PCP and everything has come back good with her memory testing. Per pt her PCP thinks it is coming from her medication or either sleep apnea. She said her pcp is very concerned with her being on these two medications for a long time

## 2020-09-30 ENCOUNTER — Telehealth: Payer: Self-pay | Admitting: Neurology

## 2020-09-30 NOTE — Telephone Encounter (Signed)
How much carbamazepine is she on now?  If her headaches are stable, no change needed.

## 2020-09-30 NOTE — Telephone Encounter (Signed)
Patient called to follow up after decreasing her carbamazepine and see what the next steps are.

## 2020-10-01 NOTE — Telephone Encounter (Signed)
Called patient and left a message for a call back.  

## 2020-10-02 NOTE — Telephone Encounter (Signed)
Pt is returning a call to mahina 

## 2020-10-03 NOTE — Telephone Encounter (Signed)
Called patient and left a message for a call back.  

## 2020-10-03 NOTE — Telephone Encounter (Signed)
Called and spoke to patient and she states that Dr. Delice Lesch reduced her Carbamazepine to one tablet daily.Patient stated that since she reduced her medication she has only had one headache. Patient would like to know if Dr. Posey Pronto thinks she should reduce her Carbamazepine again? Patient is wanting to come off medication because she feels that it is the reason she has been having short term memory loss. Patient states that all imaging and test came back normal so she is thinking the Carbamazepine might be the reason for her memory loss.   Informed patient that I would send her message to Dr. Posey Pronto and give her a call back once I hear from her. Patient verbalized understanding and had no further questions or concerns.

## 2020-10-03 NOTE — Telephone Encounter (Signed)
Glad to hear headaches are still well-controlled on lower dose of carbamazepine.  She may stop the medication and see how she does.

## 2020-10-04 NOTE — Telephone Encounter (Signed)
Called patient and left a detailed message on patients cell per DPR that Dr. Posey Pronto was glad to hear her headaches are still well-controlled on lower dose of carbamazepine and informed patient may stop the medication and see how she does. Informed patient on cell to call us if she has any questions or concerns.

## 2020-10-07 ENCOUNTER — Other Ambulatory Visit: Payer: Self-pay

## 2020-10-07 ENCOUNTER — Ambulatory Visit (INDEPENDENT_AMBULATORY_CARE_PROVIDER_SITE_OTHER): Payer: BC Managed Care – PPO | Admitting: Neurology

## 2020-10-07 ENCOUNTER — Telehealth: Payer: Self-pay | Admitting: Neurology

## 2020-10-07 DIAGNOSIS — R413 Other amnesia: Secondary | ICD-10-CM

## 2020-10-07 DIAGNOSIS — R519 Headache, unspecified: Secondary | ICD-10-CM

## 2020-10-07 DIAGNOSIS — G932 Benign intracranial hypertension: Secondary | ICD-10-CM

## 2020-10-07 DIAGNOSIS — S39012A Strain of muscle, fascia and tendon of lower back, initial encounter: Secondary | ICD-10-CM

## 2020-10-07 NOTE — Telephone Encounter (Signed)
Please call patient she would like to make sure she understand which medication she is suppose to stop and which one she is to stay on for her migraines

## 2020-10-08 NOTE — Telephone Encounter (Signed)
Called patient and informed her again on Dr. Serita Grit recommendations and advice "Glad to hear headaches are still well-controlled on lower dose of carbamazepine.  She may stop the medication and see how she does."  Patient verbalized understanding and had no further questions or concerns.

## 2020-10-18 ENCOUNTER — Ambulatory Visit: Payer: BC Managed Care – PPO | Admitting: Gastroenterology

## 2020-10-23 NOTE — Procedures (Signed)
ELECTROENCEPHALOGRAM REPORT  Dates of Recording: 10/07/2000 1:28PM to 10/09/2020 2:11AM  Patient's Name: Crystal Merritt MRN: [id number] Date of Birth: 01/26/1970  Referring Provider: Sharene Butters, PA-C  Procedure: 36:46-hour ambulatory video EEG  History: This is a 50 year old woman with memory loss, zoning out. EEG for classification.  Medications:  DIAMOX  500 mg NORVASC 10 MG tablet NORVASC  5 MG tablet LIPITOR) 40 MG tablet LIORESAL TEGRETOL XR  100 mg FOLVITE GLUCOTROL 10 MG tablet HYZAAR 100-25 MG tablet ADVIL ROBAXIN  500 mg ZOFRAN 8 MG tablet Jardiance PROTONIX  40 mg   PHENERGAN   25 mg  Technical Summary: This is a 36-hour multichannel digital video EEG recording measured by the international 10-20 system with electrodes applied with paste and impedances below 5000 ohms performed as portable with EKG monitoring.  The digital EEG was referentially recorded, reformatted, and digitally filtered in a variety of bipolar and referential montages for optimal display.    DESCRIPTION OF RECORDING: During maximal wakefulness, the background activity consisted of a symmetric 9-10 Hz posterior dominant rhythm which was reactive to eye opening.  There were no epileptiform discharges or focal slowing seen in wakefulness.  During the recording, the patient progresses through wakefulness, drowsiness, and Stage 2 sleep. During drowsiness and sleep, there is an increase in theta slowing of the background, at times with shifting asymmetry over the bilateral temporal regions. Again, there were no epileptiform discharges seen.  Events: On 9/27 at 0005 hours, she has stomach pain. Patient is lying on the couch, no clinical changes seen. Electrographically, there were no EEG or EKG changes seen.  On 9/27 at 0555 hours, she has a cramp on the left calf. Patient not on video. Electrographically, there were no EEG or EKG changes seen.  On 9/27 at 1336 hours, she had short-term memory loss.  Patient not on video. Electrographically, there were no EEG or EKG changes seen.  On 9/27 at 1600 hours, she feels weak with headache. Patient not on video. Electrographically, there were no EEG or EKG changes seen.  On 9/28 at 0630 hours, she has a cramp on the left leg. No EEG recording at this time.  There were no electrographic seizures seen.  EKG lead was unremarkable.  IMPRESSION: This 36-hour ambulatory video EEG study is within normal limits.  CLINICAL CORRELATION: A normal EEG does not exclude a clinical diagnosis of epilepsy. Episodes of stomach pain, left calf cramp, weakness/headache, and short-term memory loss did not show EEG correlate. If further clinical questions remain, inpatient video EEG monitoring may be helpful.   Ellouise Newer, M.D.

## 2020-11-22 ENCOUNTER — Encounter: Payer: Self-pay | Admitting: *Deleted

## 2020-11-22 ENCOUNTER — Ambulatory Visit (INDEPENDENT_AMBULATORY_CARE_PROVIDER_SITE_OTHER): Payer: BC Managed Care – PPO | Admitting: Gastroenterology

## 2020-11-22 ENCOUNTER — Other Ambulatory Visit: Payer: Self-pay

## 2020-11-22 ENCOUNTER — Encounter: Payer: Self-pay | Admitting: Gastroenterology

## 2020-11-22 VITALS — BP 134/92 | HR 73 | Temp 97.3°F | Ht 65.0 in | Wt 236.2 lb

## 2020-11-22 DIAGNOSIS — K219 Gastro-esophageal reflux disease without esophagitis: Secondary | ICD-10-CM

## 2020-11-22 DIAGNOSIS — Z1211 Encounter for screening for malignant neoplasm of colon: Secondary | ICD-10-CM | POA: Diagnosis not present

## 2020-11-22 DIAGNOSIS — Z8371 Family history of colonic polyps: Secondary | ICD-10-CM

## 2020-11-22 MED ORDER — CLENPIQ 10-3.5-12 MG-GM -GM/160ML PO SOLN: 1.0000 | Freq: Once | ORAL | 0 refills | Status: AC

## 2020-11-22 NOTE — H&P (View-Only) (Signed)
Primary Care Physician:  Neale Burly, MD  Primary Gastroenterologist:  Elon Alas. Abbey Chatters, DO   Chief Complaint  Patient presents with   Consult    Mother hx polyps, no TC done prior    HPI:  Crystal Merritt is a 50 y.o. female here at the request of Dr. Sherrie Sport for consideration of colonoscopy.  Due to patient's obesity and multiple comorbidities, she was brought in for evaluation today.  She has never had a colonoscopy.  Her mother has had history of colon polyps in her early 84s.  No family history of colon cancer.  Patient typically has a bowel movement at least every other day.  [place] stool 4.  Does not use laxatives.  No melena or rectal bleeding.  She has chronic GERD.  Has been on PPI for many years.  Currently on pantoprazole, as long she takes her medication every day she does okay.  Denies any vomiting, abdominal pain, dysphagia.  Occasionally uses ibuprofen.  Current Outpatient Medications  Medication Sig Dispense Refill   acetaZOLAMIDE (DIAMOX) 250 MG tablet Take 2 tablets (500 mg total) by mouth 2 (two) times daily. 360 tablet 3   amLODipine (NORVASC) 10 MG tablet Take by mouth.     aspirin 81 MG tablet Take 81 mg by mouth daily.     atorvastatin (LIPITOR) 40 MG tablet Take 40 mg by mouth daily.     baclofen (LIORESAL) 10 MG tablet Take 10 mg by mouth 3 (three) times daily.     folic acid (FOLVITE) 1 MG tablet Take 1 mg by mouth daily.     gabapentin (NEURONTIN) 100 MG capsule Take by mouth.     glipiZIDE (GLUCOTROL) 10 MG tablet      ibuprofen (ADVIL) 800 MG tablet Take 800 mg by mouth every 8 (eight) hours as needed.     JARDIANCE 25 MG TABS tablet Take 10 mg by mouth daily.     losartan-hydrochlorothiazide (HYZAAR) 100-25 MG tablet Take 1 tablet by mouth daily.     metFORMIN (GLUCOPHAGE) 500 MG tablet Take by mouth.     methocarbamol (ROBAXIN) 500 MG tablet Take 500 mg by mouth every 6 (six) hours as needed for muscle spasms.     ondansetron (ZOFRAN-ODT) 4 MG  disintegrating tablet Take 4 mg by mouth daily as needed.     pantoprazole (PROTONIX) 40 MG tablet Take 40 mg by mouth daily.     promethazine (PHENERGAN) 25 MG tablet Take 25 mg by mouth every 6 (six) hours as needed for nausea or vomiting.     carbamazepine (TEGRETOL XR) 100 MG 12 hr tablet Take 1 tablet (100 mg total) by mouth daily. 90 tablet 3   No current facility-administered medications for this visit.    Allergies as of 11/22/2020 - Review Complete 11/22/2020  Allergen Reaction Noted   Diclofenac sodium  08/21/2020    Past Medical History:  Diagnosis Date   Abnormal mammogram 07/30/2006   Anemia 04/30/2005   Atypical facial pain 08/22/2015   Back pain    Carpal tunnel syndrome 12/27/2018   Chest pain    Chronic migraine 08/22/2015   Chronic neck pain    Contusion of knee 12/17/2006   Essential hypertension, benign 12/27/2018   Fatigue    Fibromyalgia    Gastroesophageal reflux 12/27/2018   Generalized anxiety disorder 08/21/2005   Hyperlipidemia 12/27/2018   Hypertrophic and atrophic condition of skin 07/12/2008   Hypokalemia 04/30/2005   Major depressive disorder 08/21/2005   MODY  1, uncomplicated, uncontrolled    Morbid obesity    Obstructive sleep apnea    Pain in right hand 05/17/2019   Paresthesia of arm    Pseudotumor cerebri    Trigeminal neuralgia    Type II diabetes mellitus 12/27/2018    Past Surgical History:  Procedure Laterality Date   PARTIAL HYSTERECTOMY      Family History  Problem Relation Age of Onset   Hypertension Mother        Deceased   Diabetes Mother    Colon polyps Mother    Heart disease Mother    Diabetes Father        Deceased   Heart disease Brother    Heart attack Brother    Diabetes Brother    Kidney failure Brother        Deceased   Bipolar disorder Daughter        suspected but not formally diagnosed   ADD / ADHD Son     Social History   Socioeconomic History   Marital status: Married    Spouse name: Not  on file   Number of children: 2   Years of education: 14   Highest education level: Associate degree: occupational, Hotel manager, or vocational program  Occupational History   Occupation: call center  Tobacco Use   Smoking status: Former   Smokeless tobacco: Never  Scientific laboratory technician Use: Never used  Substance and Sexual Activity   Alcohol use: Yes    Comment: Socially; infrequent   Drug use: No   Sexual activity: Not on file  Other Topics Concern   Not on file  Social History Narrative   Lives with husband in a 2 story home.  Has 2 children.     Works in a call center Development worker, international [employer])    Education: 2 year college degree.      05/30/18- Pt admits to a fall about one monthago. She c/o left leg weakness and back pain.  She lives in a split level home with stairs. She is right handed.   Right handed   Social Determinants of Health   Financial Resource Strain: Not on file  Food Insecurity: Not on file  Transportation Needs: Not on file  Physical Activity: Not on file  Stress: Not on file  Social Connections: Not on file  Intimate Partner Violence: Not on file      ROS:  General: Negative for anorexia, weight loss, fever, chills, fatigue, weakness. Eyes: Negative for vision changes.  ENT: Negative for hoarseness, difficulty swallowing , nasal congestion. CV: Negative for chest pain, angina, palpitations, dyspnea on exertion, peripheral edema.  Respiratory: Negative for dyspnea at rest, dyspnea on exertion, cough, sputum, wheezing.  GI: See history of present illness. GU:  Negative for dysuria, hematuria, urinary incontinence, urinary frequency, nocturnal urination.  MS: Negative for joint pain, low back pain.  Positive fibromyalgia Derm: Negative for rash or itching.  Neuro: Negative for weakness, abnormal sensation, seizure, frequent headaches,  confusion. Psych: Negative for anxiety, depression, suicidal ideation, hallucinations.  Endo: Negative for unusual weight  change.  Heme: Negative for bruising or bleeding. Allergy: Negative for rash or hives.    Physical Examination:  BP (!) 134/92   Pulse 73   Temp (!) 97.3 F (36.3 C)   Ht 5\' 5"  (1.651 m)   Wt 236 lb 3.2 oz (107.1 kg)   BMI 39.31 kg/m    General: Well-nourished, well-developed in no acute distress.  Head: Normocephalic, atraumatic.  Eyes: Conjunctiva pink, no icterus. Mouth: masked Neck: Supple without thyromegaly, masses, or lymphadenopathy.  Lungs: Clear to auscultation bilaterally.  Heart: Regular rate and rhythm, no murmurs rubs or gallops.  Abdomen: Bowel sounds are normal, nontender, nondistended, no hepatosplenomegaly or masses, no abdominal bruits or hernia , no rebound or guarding.   Rectal: not performed Extremities: No lower extremity edema. No clubbing or deformities.  Neuro: Alert and oriented x 4 , grossly normal neurologically.  Skin: Warm and dry, no rash or jaundice.   Psych: Alert and cooperative, normal mood and affect.  Labs: Lab Results  Component Value Date   [id number] 08/22/2020   Lab Results  Component Value Date   TSH 2.00 08/22/2020   Lab Results  Component Value Date   FOLATE >24.4 08/22/2020   Lab Results  Component Value Date   FERRITIN 130.4 08/22/2020     Imaging Studies: No results found.   Assessment:  Chronic GERD: On PPI for years.  Unable to come off medication or has recurrent significant symptoms.  We discussed Barrett's esophagus screening today given her long-term reflux symptoms as well as multiple risk factors.  She is interested in pursuing EGD at this time.  Colon cancer screening/family history of colon polyps: No bowel concerns.  No prior colonoscopy.   Plan: TCS/EGD with Dr. Abbey Chatters. ASA III.  I have discussed the risks, alternatives, benefits with regards to but not limited to the risk of reaction to medication, bleeding, infection, perforation and the patient is agreeable to proceed. Written consent to  be obtained.

## 2020-11-22 NOTE — Progress Notes (Signed)
Primary Care Physician:  Neale Burly, MD  Primary Gastroenterologist:  Elon Alas. Abbey Chatters, DO   Chief Complaint  Patient presents with   Consult    Mother hx polyps, no TC done prior    HPI:  Crystal Merritt is a 50 y.o. female here at the request of Dr. Sherrie Sport for consideration of colonoscopy.  Due to patient's obesity and multiple comorbidities, she was brought in for evaluation today.  She has never had a colonoscopy.  Her mother has had history of colon polyps in her early 29s.  No family history of colon cancer.  Patient typically has a bowel movement at least every other day.  [place] stool 4.  Does not use laxatives.  No melena or rectal bleeding.  She has chronic GERD.  Has been on PPI for many years.  Currently on pantoprazole, as long she takes her medication every day she does okay.  Denies any vomiting, abdominal pain, dysphagia.  Occasionally uses ibuprofen.  Current Outpatient Medications  Medication Sig Dispense Refill   acetaZOLAMIDE (DIAMOX) 250 MG tablet Take 2 tablets (500 mg total) by mouth 2 (two) times daily. 360 tablet 3   amLODipine (NORVASC) 10 MG tablet Take by mouth.     aspirin 81 MG tablet Take 81 mg by mouth daily.     atorvastatin (LIPITOR) 40 MG tablet Take 40 mg by mouth daily.     baclofen (LIORESAL) 10 MG tablet Take 10 mg by mouth 3 (three) times daily.     folic acid (FOLVITE) 1 MG tablet Take 1 mg by mouth daily.     gabapentin (NEURONTIN) 100 MG capsule Take by mouth.     glipiZIDE (GLUCOTROL) 10 MG tablet      ibuprofen (ADVIL) 800 MG tablet Take 800 mg by mouth every 8 (eight) hours as needed.     JARDIANCE 25 MG TABS tablet Take 10 mg by mouth daily.     losartan-hydrochlorothiazide (HYZAAR) 100-25 MG tablet Take 1 tablet by mouth daily.     metFORMIN (GLUCOPHAGE) 500 MG tablet Take by mouth.     methocarbamol (ROBAXIN) 500 MG tablet Take 500 mg by mouth every 6 (six) hours as needed for muscle spasms.     ondansetron (ZOFRAN-ODT) 4 MG  disintegrating tablet Take 4 mg by mouth daily as needed.     pantoprazole (PROTONIX) 40 MG tablet Take 40 mg by mouth daily.     promethazine (PHENERGAN) 25 MG tablet Take 25 mg by mouth every 6 (six) hours as needed for nausea or vomiting.     carbamazepine (TEGRETOL XR) 100 MG 12 hr tablet Take 1 tablet (100 mg total) by mouth daily. 90 tablet 3   No current facility-administered medications for this visit.    Allergies as of 11/22/2020 - Review Complete 11/22/2020  Allergen Reaction Noted   Diclofenac sodium  08/21/2020    Past Medical History:  Diagnosis Date   Abnormal mammogram 07/30/2006   Anemia 04/30/2005   Atypical facial pain 08/22/2015   Back pain    Carpal tunnel syndrome 12/27/2018   Chest pain    Chronic migraine 08/22/2015   Chronic neck pain    Contusion of knee 12/17/2006   Essential hypertension, benign 12/27/2018   Fatigue    Fibromyalgia    Gastroesophageal reflux 12/27/2018   Generalized anxiety disorder 08/21/2005   Hyperlipidemia 12/27/2018   Hypertrophic and atrophic condition of skin 07/12/2008   Hypokalemia 04/30/2005   Major depressive disorder 08/21/2005   MODY  1, uncomplicated, uncontrolled    Morbid obesity    Obstructive sleep apnea    Pain in right hand 05/17/2019   Paresthesia of arm    Pseudotumor cerebri    Trigeminal neuralgia    Type II diabetes mellitus 12/27/2018    Past Surgical History:  Procedure Laterality Date   PARTIAL HYSTERECTOMY      Family History  Problem Relation Age of Onset   Hypertension Mother        Deceased   Diabetes Mother    Colon polyps Mother    Heart disease Mother    Diabetes Father        Deceased   Heart disease Brother    Heart attack Brother    Diabetes Brother    Kidney failure Brother        Deceased   Bipolar disorder Daughter        suspected but not formally diagnosed   ADD / ADHD Son     Social History   Socioeconomic History   Marital status: Married    Spouse name: Not  on file   Number of children: 2   Years of education: 14   Highest education level: Associate degree: occupational, Hotel manager, or vocational program  Occupational History   Occupation: call center  Tobacco Use   Smoking status: Former   Smokeless tobacco: Never  Scientific laboratory technician Use: Never used  Substance and Sexual Activity   Alcohol use: Yes    Comment: Socially; infrequent   Drug use: No   Sexual activity: Not on file  Other Topics Concern   Not on file  Social History Narrative   Lives with husband in a 2 story home.  Has 2 children.     Works in a call center Development worker, international aid)    Education: 2 year college degree.      05/30/18- Pt admits to a fall about one monthago. She c/o left leg weakness and back pain.  She lives in a split level home with stairs. She is right handed.   Right handed   Social Determinants of Health   Financial Resource Strain: Not on file  Food Insecurity: Not on file  Transportation Needs: Not on file  Physical Activity: Not on file  Stress: Not on file  Social Connections: Not on file  Intimate Partner Violence: Not on file      ROS:  General: Negative for anorexia, weight loss, fever, chills, fatigue, weakness. Eyes: Negative for vision changes.  ENT: Negative for hoarseness, difficulty swallowing , nasal congestion. CV: Negative for chest pain, angina, palpitations, dyspnea on exertion, peripheral edema.  Respiratory: Negative for dyspnea at rest, dyspnea on exertion, cough, sputum, wheezing.  GI: See history of present illness. GU:  Negative for dysuria, hematuria, urinary incontinence, urinary frequency, nocturnal urination.  MS: Negative for joint pain, low back pain.  Positive fibromyalgia Derm: Negative for rash or itching.  Neuro: Negative for weakness, abnormal sensation, seizure, frequent headaches,  confusion. Psych: Negative for anxiety, depression, suicidal ideation, hallucinations.  Endo: Negative for unusual weight  change.  Heme: Negative for bruising or bleeding. Allergy: Negative for rash or hives.    Physical Examination:  BP (!) 134/92   Pulse 73   Temp (!) 97.3 F (36.3 C)   Ht 5\' 5"  (1.651 m)   Wt 236 lb 3.2 oz (107.1 kg)   BMI 39.31 kg/m    General: Well-nourished, well-developed in no acute distress.  Head: Normocephalic, atraumatic.  Eyes: Conjunctiva pink, no icterus. Mouth: masked Neck: Supple without thyromegaly, masses, or lymphadenopathy.  Lungs: Clear to auscultation bilaterally.  Heart: Regular rate and rhythm, no murmurs rubs or gallops.  Abdomen: Bowel sounds are normal, nontender, nondistended, no hepatosplenomegaly or masses, no abdominal bruits or hernia , no rebound or guarding.   Rectal: not performed Extremities: No lower extremity edema. No clubbing or deformities.  Neuro: Alert and oriented x 4 , grossly normal neurologically.  Skin: Warm and dry, no rash or jaundice.   Psych: Alert and cooperative, normal mood and affect.  Labs: Lab Results  Component Value Date   [id number] 188 08/22/2020   Lab Results  Component Value Date   TSH 2.00 08/22/2020   Lab Results  Component Value Date   FOLATE >24.4 08/22/2020   Lab Results  Component Value Date   FERRITIN 130.4 08/22/2020     Imaging Studies: No results found.   Assessment:  Chronic GERD: On PPI for years.  Unable to come off medication or has recurrent significant symptoms.  We discussed Barrett's esophagus screening today given her long-term reflux symptoms as well as multiple risk factors.  She is interested in pursuing EGD at this time.  Colon cancer screening/family history of colon polyps: No bowel concerns.  No prior colonoscopy.   Plan: TCS/EGD with Dr. Abbey Chatters. ASA III.  I have discussed the risks, alternatives, benefits with regards to but not limited to the risk of reaction to medication, bleeding, infection, perforation and the patient is agreeable to proceed. Written consent to  be obtained.

## 2020-11-22 NOTE — Patient Instructions (Signed)
Colonoscopy and upper endoscopy to be scheduled. See separate instructions.  

## 2020-11-25 ENCOUNTER — Encounter: Payer: Self-pay | Admitting: *Deleted

## 2020-12-12 ENCOUNTER — Other Ambulatory Visit (HOSPITAL_COMMUNITY): Payer: BC Managed Care – PPO

## 2020-12-12 NOTE — Patient Instructions (Signed)
Crystal Merritt  [birth year]     @PREFPERIOPPHARMACY @   Your procedure is scheduled on 12/16/2020.   Report to [facility] at  [place] AM    Call this number if you have problems the morning of surgery:  [phone]   Remember:  Follow the diet and prep instructions given to you by the office.    Take these medicines the morning of surgery with A SIP OF WATER           diamox, amlodipine, robaxin(if needed), zofran(if needed), protonix, phenergan.     Do not wear jewelry, make-up or nail polish.  Do not wear lotions, powders, or perfumes, or deodorant.  Do not shave 48 hours prior to surgery.  Men may shave face and neck.  Do not bring valuables to the hospital.  [facility] is not responsible for any belongings or valuables.  Contacts, dentures or bridgework may not be worn into surgery.  Leave your suitcase in the car.  After surgery it may be brought to your room.  For patients admitted to the hospital, discharge time will be determined by your treatment team.  Patients discharged the day of surgery will not be allowed to drive home and must have someone with them for 24 hours.    Special instructions:   DO NOT smoke tobacco or vape for 24 hours before your procedure.  Please read over the following fact sheets that you were given. Anesthesia Post-op Instructions and Care and Recovery After Surgery      Upper Endoscopy, Adult, Care After This sheet gives you information about how to care for yourself after your procedure. Your health care provider may also give you more specific instructions. If you have problems or questions, contact your health care provider. What can I expect after the procedure? After the procedure, it is common to have: A sore throat. Mild stomach pain or discomfort. Bloating. Nausea. Follow these instructions at home:  Follow instructions from your health care provider about what to eat or drink after your procedure. Return to  your normal activities as told by your health care provider. Ask your health care provider what activities are safe for you. Take over-the-counter and prescription medicines only as told by your health care provider. If you were given a sedative during the procedure, it can affect you for several hours. Do not drive or operate machinery until your health care provider says that it is safe. Keep all follow-up visits as told by your health care provider. This is important. Contact a health care provider if you have: A sore throat that lasts longer than one day. Trouble swallowing. Get help right away if: You vomit blood or your vomit looks like coffee grounds. You have: A fever. Bloody, black, or tarry stools. A severe sore throat or you cannot swallow. Difficulty breathing. Severe pain in your chest or abdomen. Summary After the procedure, it is common to have a sore throat, mild stomach discomfort, bloating, and nausea. If you were given a sedative during the procedure, it can affect you for several hours. Do not drive or operate machinery until your health care provider says that it is safe. Follow instructions from your health care provider about what to eat or drink after your procedure. Return to your normal activities as told by your health care provider. This information is not intended to replace advice given to you by your health care provider. Make sure you discuss any  questions you have with your health care provider. Document Revised: 11/04/2018 Document Reviewed: 05/31/2017 Elsevier Patient Education  2022 [place]. Colonoscopy, Adult, Care After This sheet gives you information about how to care for yourself after your procedure. Your health care provider may also give you more specific instructions. If you have problems or questions, contact your health care provider. What can I expect after the procedure? After the procedure, it is common to have: A small amount of  blood in your stool for 24 hours after the procedure. Some gas. Mild cramping or bloating of your abdomen. Follow these instructions at home: Eating and drinking  Drink enough fluid to keep your urine pale yellow. Follow instructions from your health care provider about eating or drinking restrictions. Resume your normal diet as instructed by your health care provider. Avoid heavy or fried foods that are hard to digest. Activity Rest as told by your health care provider. Avoid sitting for a long time without moving. Get up to take short walks every 1-2 hours. This is important to improve blood flow and breathing. Ask for help if you feel weak or unsteady. Return to your normal activities as told by your health care provider. Ask your health care provider what activities are safe for you. Managing cramping and bloating  Try walking around when you have cramps or feel bloated. Apply heat to your abdomen as told by your health care provider. Use the heat source that your health care provider recommends, such as a moist heat pack or a heating pad. Place a towel between your skin and the heat source. Leave the heat on for 20-30 minutes. Remove the heat if your skin turns bright red. This is especially important if you are unable to feel pain, heat, or cold. You may have a greater risk of getting burned. General instructions If you were given a sedative during the procedure, it can affect you for several hours. Do not drive or operate machinery until your health care provider says that it is safe. For the first 24 hours after the procedure: Do not sign important documents. Do not drink alcohol. Do your regular daily activities at a slower pace than normal. Eat soft foods that are easy to digest. Take over-the-counter and prescription medicines only as told by your health care provider. Keep all follow-up visits as told by your health care provider. This is important. Contact a health care  provider if: You have blood in your stool 2-3 days after the procedure. Get help right away if you have: More than a small spotting of blood in your stool. Large blood clots in your stool. Swelling of your abdomen. Nausea or vomiting. A fever. Increasing pain in your abdomen that is not relieved with medicine. Summary After the procedure, it is common to have a small amount of blood in your stool. You may also have mild cramping and bloating of your abdomen. If you were given a sedative during the procedure, it can affect you for several hours. Do not drive or operate machinery until your health care provider says that it is safe. Get help right away if you have a lot of blood in your stool, nausea or vomiting, a fever, or increased pain in your abdomen. This information is not intended to replace advice given to you by your health care provider. Make sure you discuss any questions you have with your health care provider. Document Revised: 11/04/2018 Document Reviewed: 07/25/2018 Elsevier Patient Education  [place]  Anesthesia Care, Care After This sheet gives you information about how to care for yourself after your procedure. Your health care provider may also give you more specific instructions. If you have problems or questions, contact your health care provider. What can I expect after the procedure? After the procedure, it is common to have: Tiredness. Forgetfulness about what happened after the procedure. Impaired judgment for important decisions. Nausea or vomiting. Some difficulty with balance. Follow these instructions at home: For the time period you were told by your health care provider:   Rest as needed. Do not participate in activities where you could fall or become injured. Do not drive or use machinery. Do not drink alcohol. Do not take sleeping pills or medicines that cause drowsiness. Do not make important decisions or sign legal documents. Do  not take care of children on your own. Eating and drinking Follow the diet that is recommended by your health care provider. Drink enough fluid to keep your urine pale yellow. If you vomit: Drink water, juice, or soup when you can drink without vomiting. Make sure you have little or no nausea before eating solid foods. General instructions Have a responsible adult stay with you for the time you are told. It is important to have someone help care for you until you are awake and alert. Take over-the-counter and prescription medicines only as told by your health care provider. If you have sleep apnea, surgery and certain medicines can increase your risk for breathing problems. Follow instructions from your health care provider about wearing your sleep device: Anytime you are sleeping, including during daytime naps. While taking prescription pain medicines, sleeping medicines, or medicines that make you drowsy. Avoid smoking. Keep all follow-up visits as told by your health care provider. This is important. Contact a health care provider if: You keep feeling nauseous or you keep vomiting. You feel light-headed. You are still sleepy or having trouble with balance after 24 hours. You develop a rash. You have a fever. You have redness or swelling around the IV site. Get help right away if: You have trouble breathing. You have new-onset confusion at home. Summary For several hours after your procedure, you may feel tired. You may also be forgetful and have poor judgment. Have a responsible adult stay with you for the time you are told. It is important to have someone help care for you until you are awake and alert. Rest as told. Do not drive or operate machinery. Do not drink alcohol or take sleeping pills. Get help right away if you have trouble breathing, or if you suddenly become confused. This information is not intended to replace advice given to you by your health care provider. Make sure  you discuss any questions you have with your health care provider. Document Revised: 09/14/2019 Document Reviewed: 12/01/2018 Elsevier Patient Education  2022 [employer].

## 2020-12-13 ENCOUNTER — Other Ambulatory Visit: Payer: Self-pay

## 2020-12-13 ENCOUNTER — Telehealth: Payer: Self-pay | Admitting: Internal Medicine

## 2020-12-13 ENCOUNTER — Encounter (HOSPITAL_COMMUNITY): Payer: Self-pay

## 2020-12-13 ENCOUNTER — Encounter (HOSPITAL_COMMUNITY)
Admission: RE | Admit: 2020-12-13 | Discharge: 2020-12-13 | Disposition: A | Discharge status: Home / Self Care | Payer: BC Managed Care – PPO | Source: Ambulatory Visit | Attending: Internal Medicine | Admitting: Internal Medicine

## 2020-12-13 VITALS — BP 106/85 | HR 63 | Temp 97.6°F | Resp 18 | Ht 65.0 in | Wt 231.0 lb

## 2020-12-13 DIAGNOSIS — K648 Other hemorrhoids: Secondary | ICD-10-CM | POA: Diagnosis not present

## 2020-12-13 DIAGNOSIS — Z87891 Personal history of nicotine dependence: Secondary | ICD-10-CM | POA: Diagnosis not present

## 2020-12-13 DIAGNOSIS — E119 Type 2 diabetes mellitus without complications: Secondary | ICD-10-CM

## 2020-12-13 DIAGNOSIS — D122 Benign neoplasm of ascending colon: Secondary | ICD-10-CM | POA: Diagnosis not present

## 2020-12-13 DIAGNOSIS — K317 Polyp of stomach and duodenum: Secondary | ICD-10-CM | POA: Diagnosis not present

## 2020-12-13 DIAGNOSIS — Z01818 Encounter for other preprocedural examination: Secondary | ICD-10-CM | POA: Insufficient documentation

## 2020-12-13 DIAGNOSIS — Z1211 Encounter for screening for malignant neoplasm of colon: Secondary | ICD-10-CM | POA: Diagnosis not present

## 2020-12-13 DIAGNOSIS — D649 Anemia, unspecified: Secondary | ICD-10-CM

## 2020-12-13 DIAGNOSIS — Z13810 Encounter for screening for upper gastrointestinal disorder: Secondary | ICD-10-CM | POA: Diagnosis not present

## 2020-12-13 DIAGNOSIS — Z6839 Body mass index (BMI) 39.0-39.9, adult: Secondary | ICD-10-CM | POA: Diagnosis not present

## 2020-12-13 DIAGNOSIS — K2970 Gastritis, unspecified, without bleeding: Secondary | ICD-10-CM | POA: Diagnosis not present

## 2020-12-13 DIAGNOSIS — K219 Gastro-esophageal reflux disease without esophagitis: Secondary | ICD-10-CM | POA: Diagnosis not present

## 2020-12-13 LAB — BASIC METABOLIC PANEL
Anion gap: 10 (ref 5–15)
BUN: 28 mg/dL — ABNORMAL HIGH (ref 6–20)
CO2: 23 mmol/L (ref 22–32)
Calcium: 8.9 mg/dL (ref 8.9–10.3)
Chloride: 107 mmol/L (ref 98–111)
Creatinine, Ser: 0.96 mg/dL (ref 0.44–1.00)
GFR, Estimated: >60 mL/min (ref >60)
Glucose, Bld: 97 mg/dL (ref 70–99)
Potassium: 2.6 mmol/L — CL (ref 3.5–5.1)
Sodium: 140 mmol/L (ref 135–145)

## 2020-12-13 LAB — CBC WITH DIFFERENTIAL/PLATELET
Abs Immature Granulocytes: 0.02 10*3/uL (ref 0.00–0.07)
Basophils Absolute: 0.1 10*3/uL (ref 0.0–0.1)
Basophils Relative: 1 %
Eosinophils Absolute: 0.3 10*3/uL (ref 0.0–0.5)
Eosinophils Relative: 4 %
HCT: 38.1 % (ref 36.0–46.0)
Hemoglobin: 11.7 g/dL — ABNORMAL LOW (ref 12.0–15.0)
Immature Granulocytes: 0 %
Lymphocytes Relative: 38 %
Lymphs Abs: 3 10*3/uL (ref 0.7–4.0)
MCH: 22 pg — ABNORMAL LOW (ref 26.0–34.0)
MCHC: 30.7 g/dL (ref 30.0–36.0)
MCV: 71.5 fL — ABNORMAL LOW (ref 80.0–100.0)
Monocytes Absolute: 0.7 10*3/uL (ref 0.1–1.0)
Monocytes Relative: 9 %
Neutro Abs: 3.8 10*3/uL (ref 1.7–7.7)
Neutrophils Relative %: 48 %
Platelets: 274 10*3/uL (ref 150–400)
RBC: 5.33 MIL/uL — ABNORMAL HIGH (ref 3.87–5.11)
RDW: 16 % — ABNORMAL HIGH (ref 11.5–15.5)
WBC: 7.8 10*3/uL (ref 4.0–10.5)
nRBC: 0 % (ref 0.0–0.2)

## 2020-12-13 MED ORDER — POTASSIUM CHLORIDE CRYS ER 20 MEQ PO TBCR: 40.0000 meq | EXTENDED_RELEASE_TABLET | Freq: Three times a day (TID) | ORAL | 0 refills | Status: AC

## 2020-12-13 NOTE — Pre-Procedure Instructions (Signed)
Dr Isaias Sakai about potassium of 2.6.

## 2020-12-13 NOTE — Telephone Encounter (Signed)
Patient with preop labs today.  Potassium 2.6.  I attempted to call patient multiple times that went to voicemail.  I left her voicemail in regards to her low potassium.  I have also left instructions to take 40 mEq of potassium 3 times daily until her procedures on Monday which I will send into her pharmacy now.

## 2020-12-14 ENCOUNTER — Other Ambulatory Visit: Payer: Self-pay | Admitting: Internal Medicine

## 2020-12-16 ENCOUNTER — Ambulatory Visit (HOSPITAL_COMMUNITY): Payer: BC Managed Care – PPO | Admitting: Anesthesiology

## 2020-12-16 ENCOUNTER — Encounter (HOSPITAL_COMMUNITY)
Admission: RE | Disposition: A | Discharge status: Home / Self Care | Payer: Self-pay | Source: Home / Self Care | Attending: Internal Medicine

## 2020-12-16 ENCOUNTER — Encounter (HOSPITAL_COMMUNITY): Payer: Self-pay

## 2020-12-16 ENCOUNTER — Ambulatory Visit (HOSPITAL_COMMUNITY)
Admission: RE | Admit: 2020-12-16 | Discharge: 2020-12-16 | Disposition: A | Discharge status: Home / Self Care | Payer: BC Managed Care – PPO | Attending: Internal Medicine | Admitting: Internal Medicine

## 2020-12-16 DIAGNOSIS — Z87891 Personal history of nicotine dependence: Secondary | ICD-10-CM | POA: Insufficient documentation

## 2020-12-16 DIAGNOSIS — K317 Polyp of stomach and duodenum: Secondary | ICD-10-CM | POA: Insufficient documentation

## 2020-12-16 DIAGNOSIS — D122 Benign neoplasm of ascending colon: Secondary | ICD-10-CM | POA: Insufficient documentation

## 2020-12-16 DIAGNOSIS — Z6839 Body mass index (BMI) 39.0-39.9, adult: Secondary | ICD-10-CM | POA: Insufficient documentation

## 2020-12-16 DIAGNOSIS — Z13810 Encounter for screening for upper gastrointestinal disorder: Secondary | ICD-10-CM | POA: Insufficient documentation

## 2020-12-16 DIAGNOSIS — K635 Polyp of colon: Secondary | ICD-10-CM | POA: Diagnosis not present

## 2020-12-16 DIAGNOSIS — K219 Gastro-esophageal reflux disease without esophagitis: Secondary | ICD-10-CM | POA: Insufficient documentation

## 2020-12-16 DIAGNOSIS — Z1211 Encounter for screening for malignant neoplasm of colon: Secondary | ICD-10-CM | POA: Insufficient documentation

## 2020-12-16 DIAGNOSIS — K2970 Gastritis, unspecified, without bleeding: Secondary | ICD-10-CM | POA: Insufficient documentation

## 2020-12-16 DIAGNOSIS — K648 Other hemorrhoids: Secondary | ICD-10-CM | POA: Insufficient documentation

## 2020-12-16 HISTORY — PX: ESOPHAGOGASTRODUODENOSCOPY (EGD) WITH PROPOFOL: SHX5813

## 2020-12-16 HISTORY — PX: COLONOSCOPY WITH PROPOFOL: SHX5780

## 2020-12-16 HISTORY — PX: POLYPECTOMY: SHX5525

## 2020-12-16 HISTORY — PX: BIOPSY: SHX5522

## 2020-12-16 LAB — BASIC METABOLIC PANEL WITH GFR
Anion gap: 6 (ref 5–15)
BUN: 12 mg/dL (ref 6–20)
CO2: 23 mmol/L (ref 22–32)
Calcium: 8.4 mg/dL — ABNORMAL LOW (ref 8.9–10.3)
Chloride: 111 mmol/L (ref 98–111)
Creatinine, Ser: 0.78 mg/dL (ref 0.44–1.00)
GFR, Estimated: >60 mL/min
Glucose, Bld: 98 mg/dL (ref 70–99)
Potassium: 3.2 mmol/L — ABNORMAL LOW (ref 3.5–5.1)
Sodium: 140 mmol/L (ref 135–145)

## 2020-12-16 LAB — POCT I-STAT, CHEM 8
BUN: 12 mg/dL (ref 6–20)
Calcium, Ion: 1.18 mmol/L (ref 1.15–1.40)
Chloride: 108 mmol/L (ref 98–111)
Creatinine, Ser: 0.8 mg/dL (ref 0.44–1.00)
Glucose, Bld: 167 mg/dL — ABNORMAL HIGH (ref 70–99)
HCT: 40 % (ref 36.0–46.0)
Hemoglobin: 13.6 g/dL (ref 12.0–15.0)
Potassium: 2.9 mmol/L — ABNORMAL LOW (ref 3.5–5.1)
Sodium: 145 mmol/L (ref 135–145)
TCO2: 23 mmol/L (ref 22–32)

## 2020-12-16 SURGERY — COLONOSCOPY WITH PROPOFOL
Anesthesia: General

## 2020-12-16 MED ORDER — POTASSIUM CHLORIDE CRYS ER 20 MEQ PO TBCR
20.0000 meq | EXTENDED_RELEASE_TABLET | Freq: Once | ORAL | Status: AC
  Administered 2020-12-16: 20 meq via ORAL
  Filled 2020-12-16: qty 1

## 2020-12-16 MED ORDER — LIDOCAINE HCL (CARDIAC) PF 100 MG/5ML IV SOSY
PREFILLED_SYRINGE | INTRAVENOUS | Status: DC | PRN
  Administered 2020-12-16: 50 mg via INTRAVENOUS

## 2020-12-16 MED ORDER — PHENYLEPHRINE 40 MCG/ML (10ML) SYRINGE FOR IV PUSH (FOR BLOOD PRESSURE SUPPORT)
PREFILLED_SYRINGE | INTRAVENOUS | Status: DC | PRN
  Administered 2020-12-16: 80 ug via INTRAVENOUS

## 2020-12-16 MED ORDER — POTASSIUM CHLORIDE 10 MEQ/100ML IV SOLN
10.0000 meq | INTRAVENOUS | Status: AC
  Administered 2020-12-16 (×3): 10 meq via INTRAVENOUS
  Filled 2020-12-16: qty 100

## 2020-12-16 MED ORDER — PROPOFOL 500 MG/50ML IV EMUL
INTRAVENOUS | Status: DC | PRN
Start: 2020-12-16 — End: 2020-12-16
  Administered 2020-12-16: 150 ug/kg/min via INTRAVENOUS

## 2020-12-16 MED ORDER — PHENYLEPHRINE 40 MCG/ML (10ML) SYRINGE FOR IV PUSH (FOR BLOOD PRESSURE SUPPORT)
PREFILLED_SYRINGE | INTRAVENOUS | Status: AC
  Filled 2020-12-16: qty 10

## 2020-12-16 MED ORDER — LACTATED RINGERS IV SOLN
INTRAVENOUS | Status: DC
  Administered 2020-12-16: 1000 mL via INTRAVENOUS

## 2020-12-16 MED ORDER — PROPOFOL 10 MG/ML IV BOLUS
INTRAVENOUS | Status: DC | PRN
  Administered 2020-12-16: 50 mg via INTRAVENOUS
  Administered 2020-12-16: 100 mg via INTRAVENOUS

## 2020-12-16 MED ORDER — EPHEDRINE SULFATE-NACL 50-0.9 MG/10ML-% IV SOSY
PREFILLED_SYRINGE | INTRAVENOUS | Status: DC | PRN
  Administered 2020-12-16: 5 mg via INTRAVENOUS

## 2020-12-16 MED ORDER — EPHEDRINE 5 MG/ML INJ
INTRAVENOUS | Status: AC
  Filled 2020-12-16: qty 5

## 2020-12-16 NOTE — Op Note (Signed)
[facility] Patient Name: Crystal Merritt Procedure Date: 12/16/2020 1:12 PM MRN: [id number] Date of Birth: 07-04-1970 Attending MD: Elon Alas. Edgar Frisk CSN: [id number] Age: 50 Admit Type: Outpatient Procedure:                Colonoscopy Indications:              Screening for colorectal malignant neoplasm Providers:                Elon Alas. Abbey Chatters, DO, Charlsie Quest. Theda Sers RN, RN,                            Nelma Rothman, Technician Referring MD:              Medicines:                See the Anesthesia note for documentation of the                            administered medications Complications:            No immediate complications. Estimated Blood Loss:     Estimated blood loss was minimal. Procedure:                Pre-Anesthesia Assessment:                           - The anesthesia plan was to use monitored                            anesthesia care (MAC).                           After obtaining informed consent, the colonoscope                            was passed under direct vision. Throughout the                            procedure, the patient's blood pressure, pulse, and                            oxygen saturations were monitored continuously. The                            [id number]-HQ190L ([id number]) scope was introduced through                            the anus and advanced to the the cecum, identified                            by appendiceal orifice and ileocecal valve. The                            colonoscopy was performed without difficulty. The                            patient tolerated the procedure  well. The quality                            of the bowel preparation was evaluated using the                            BBPS [facility] Bowel Preparation Scale) with scores                            of: Right Colon = 2 (minor amount of residual                            staining, small fragments of stool and/or opaque                            liquid, but  mucosa seen well), Transverse Colon = 3                            (entire mucosa seen well with no residual staining,                            small fragments of stool or opaque liquid) and Left                            Colon = 3 (entire mucosa seen well with no residual                            staining, small fragments of stool or opaque                            liquid). The total BBPS score equals 8. The quality                            of the bowel preparation was good. Scope In: 1:17:06 PM Scope Out: 1:33:58 PM Scope Withdrawal Time: 0 hours 13 minutes 0 seconds  Total Procedure Duration: 0 hours 16 minutes 52 seconds  Findings:      Hemorrhoids were found on perianal exam.      Non-bleeding internal hemorrhoids were found during endoscopy.      A 8 mm polyp/possible lipoma was found in the proximal transverse colon.       The polyp was sessile. The polyp was removed with a cold snare.       Resection and retrieval were complete.      A 5 mm polyp was found in the sigmoid colon. The polyp was sessile. The       polyp was removed with a cold snare. Resection and retrieval were       complete.      The exam was otherwise without abnormality. Impression:               - Hemorrhoids found on perianal exam.                           - Non-bleeding internal hemorrhoids.                           -  Two 3 to 5 mm polyps in the ascending colon,                            removed with a cold snare. Resected and retrieved.                           - One 8 mm polyp in the proximal transverse colon,                            removed with a cold snare. Resected and retrieved.                           - One 5 mm polyp in the sigmoid colon, removed with                            a cold snare. Resected and retrieved.                           - The examination was otherwise normal. Moderate Sedation:      Per Anesthesia Care Recommendation:           - Patient has a contact number  available for                            emergencies. The signs and symptoms of potential                            delayed complications were discussed with the                            patient. Return to normal activities tomorrow.                            Written discharge instructions were provided to the                            patient.                           - Resume previous diet.                           - Continue present medications.                           - Await pathology results.                           - Repeat colonoscopy in 5 years for surveillance.                           - Return to GI clinic PRN. Procedure Code(s):        --- Professional ---  45385, Colonoscopy, flexible; with removal of                            tumor(s), polyp(s), or other lesion(s) by snare                            technique Diagnosis Code(s):        --- Professional ---                           K63.5, Polyp of colon                           Z12.11, Encounter for screening for malignant                            neoplasm of colon                           K64.8, Other hemorrhoids CPT copyright 2019 American Medical Association. All rights reserved. The codes documented in this report are preliminary and upon coder review may  be revised to meet current compliance requirements. Elon Alas. Abbey Chatters, DO [place] Abbey Chatters, DO 12/16/2020 1:41:01 PM This report has been signed electronically. Number of Addenda: 0

## 2020-12-16 NOTE — Interval H&P Note (Signed)
History and Physical Interval Note:  10/15/457 13:68 PM  Crystal Merritt  has presented today for surgery, with the diagnosis of gerd, fh colon polyps, screening colonoscopy.  The various methods of treatment have been discussed with the patient and family. After consideration of risks, benefits and other options for treatment, the patient has consented to  Procedure(s) with comments: COLONOSCOPY WITH PROPOFOL (N/A) - 7:30am ESOPHAGOGASTRODUODENOSCOPY (EGD) WITH PROPOFOL (N/A) as a surgical intervention.  The patient's history has been reviewed, patient examined, no change in status, stable for surgery.  I have reviewed the patient's chart and labs.  Questions were answered to the patient's satisfaction.     Eloise Harman

## 2020-12-16 NOTE — Discharge Instructions (Signed)
EGD Discharge instructions Please read the instructions outlined below and refer to this sheet in the next few weeks. These discharge instructions provide you with general information on caring for yourself after you leave the hospital. Your doctor may also give you specific instructions. While your treatment has been planned according to the most current medical practices available, unavoidable complications occasionally occur. If you have any problems or questions after discharge, please call your doctor. ACTIVITY You may resume your regular activity but move at a slower pace for the next 24 hours.  Take frequent rest periods for the next 24 hours.  Walking will help expel (get rid of) the air and reduce the bloated feeling in your abdomen.  No driving for 24 hours (because of the anesthesia (medicine) used during the test).  You may shower.  Do not sign any important legal documents or operate any machinery for 24 hours (because of the anesthesia used during the test).  NUTRITION Drink plenty of fluids.  You may resume your normal diet.  Begin with a light meal and progress to your normal diet.  Avoid alcoholic beverages for 24 hours or as instructed by your caregiver.  MEDICATIONS You may resume your normal medications unless your caregiver tells you otherwise.  WHAT YOU CAN EXPECT TODAY You may experience abdominal discomfort such as a feeling of fullness or "gas" pains.  FOLLOW-UP Your doctor will discuss the results of your test with you.  SEEK IMMEDIATE MEDICAL ATTENTION IF ANY OF THE FOLLOWING OCCUR: Excessive nausea (feeling sick to your stomach) and/or vomiting.  Severe abdominal pain and distention (swelling).  Trouble swallowing.  Temperature over 101 F (37.8 C).  Rectal bleeding or vomiting of blood.    Colonoscopy Discharge Instructions  Read the instructions outlined below and refer to this sheet in the next few weeks. These discharge instructions provide you with  general information on caring for yourself after you leave the hospital. Your doctor may also give you specific instructions. While your treatment has been planned according to the most current medical practices available, unavoidable complications occasionally occur.   ACTIVITY You may resume your regular activity, but move at a slower pace for the next 24 hours.  Take frequent rest periods for the next 24 hours.  Walking will help get rid of the air and reduce the bloated feeling in your belly (abdomen).  No driving for 24 hours (because of the medicine (anesthesia) used during the test).   Do not sign any important legal documents or operate any machinery for 24 hours (because of the anesthesia used during the test).  NUTRITION Drink plenty of fluids.  You may resume your normal diet as instructed by your doctor.  Begin with a light meal and progress to your normal diet. Heavy or fried foods are harder to digest and may make you feel sick to your stomach (nauseated).  Avoid alcoholic beverages for 24 hours or as instructed.  MEDICATIONS You may resume your normal medications unless your doctor tells you otherwise.  WHAT YOU CAN EXPECT TODAY Some feelings of bloating in the abdomen.  Passage of more gas than usual.  Spotting of blood in your stool or on the toilet paper.  IF YOU HAD POLYPS REMOVED DURING THE COLONOSCOPY: No aspirin products for 7 days or as instructed.  No alcohol for 7 days or as instructed.  Eat a soft diet for the next 24 hours.  FINDING OUT THE RESULTS OF YOUR TEST Not all test results are available  during your visit. If your test results are not back during the visit, make an appointment with your caregiver to find out the results. Do not assume everything is normal if you have not heard from your caregiver or the medical facility. It is important for you to follow up on all of your test results.  SEEK IMMEDIATE MEDICAL ATTENTION IF: You have more than a spotting of  blood in your stool.  Your belly is swollen (abdominal distention).  You are nauseated or vomiting.  You have a temperature over 101.  You have abdominal pain or discomfort that is severe or gets worse throughout the day.   Your EGD revealed mild amount inflammation in your stomach.  I took biopsies of this to rule out infection with a bacteria called H. pylori.  Await pathology results, my office will contact you. Continue on Pantoprazole. I did not see any evidence of Barretts esophagus.   Your colonoscopy revealed 4 polyp(s) which I removed successfully. Await pathology results, my office will contact you. I recommend repeating colonoscopy in 3-5 years for surveillance purposes.   Follow up with GI as needed.    I hope you have a great rest of your week!  Elon Alas. Abbey Chatters, D.O. Gastroenterology and Hepatology [facility]

## 2020-12-16 NOTE — Progress Notes (Signed)
Pt admitted to preop this morning. Obtained ordered IStat.  K+ was 2.9.  Dr. Charna Elizabeth orderded po & 3 KCl IV piggybacks.  BMET obtained & K+ increased to 3.2.  OK with Dr. Charna Elizabeth to proceed with procedure.  Pt. & husband were kept informed throughout.

## 2020-12-16 NOTE — Op Note (Signed)
[facility] Patient Name: Crystal Merritt Procedure Date: 12/16/2020 12:52 PM MRN: [id number] Date of Birth: 01/24/1970 Attending MD: Elon Alas. Edgar Frisk CSN: [id number] Age: 50 Admit Type: Outpatient Procedure:                Upper GI endoscopy Indications:              Screening for Barrett's esophagus Providers:                Elon Alas. Abbey Chatters, DO, Charlsie Quest. Theda Sers RN, RN,                            Nelma Rothman, Technician Referring MD:              Medicines:                See the Anesthesia note for documentation of the                            administered medications Complications:            No immediate complications. Estimated Blood Loss:     Estimated blood loss was minimal. Procedure:                Pre-Anesthesia Assessment:                           - The anesthesia plan was to use monitored                            anesthesia care (MAC).                           After obtaining informed consent, the endoscope was                            passed under direct vision. Throughout the                            procedure, the patient's blood pressure, pulse, and                            oxygen saturations were monitored continuously. The                            GIF-H190 ([id number]) scope was introduced through the                            mouth, and advanced to the second part of duodenum.                            The upper GI endoscopy was accomplished without                            difficulty. The patient tolerated the procedure                            well. Scope  In: 1:07:50 PM Scope Out: 1:11:27 PM Total Procedure Duration: 0 hours 3 minutes 37 seconds  Findings:      The Z-line was regular and was found 40 cm from the incisors.      Multiple small fundic gland polyps with no bleeding and no stigmata of       recent bleeding were found in the gastric fundus.      Mild inflammation characterized by erythema was found in the gastric        body and in the gastric antrum. Biopsies were taken with a cold forceps       for Helicobacter pylori testing.      The duodenal bulb, first portion of the duodenum and second portion of       the duodenum were normal. Impression:               - Z-line regular, 40 cm from the incisors.                           - Multiple gastric polyps.                           - Gastritis. Biopsied.                           - Normal duodenal bulb, first portion of the                            duodenum and second portion of the duodenum. Moderate Sedation:      Per Anesthesia Care Recommendation:           - Patient has a contact number available for                            emergencies. The signs and symptoms of potential                            delayed complications were discussed with the                            patient. Return to normal activities tomorrow.                            Written discharge instructions were provided to the                            patient.                           - Resume previous diet.                           - Continue present medications.                           - Await pathology results.                           - Return to GI clinic  PRN. Procedure Code(s):        --- Professional ---                           [phone], Esophagogastroduodenoscopy, flexible,                            transoral; with biopsy, single or multiple Diagnosis Code(s):        --- Professional ---                           K31.7, Polyp of stomach and duodenum                           K29.70, Gastritis, unspecified, without bleeding                           Z13.810, Encounter for screening for upper                            gastrointestinal disorder CPT copyright 2019 [employer]. All rights reserved. The codes documented in this report are preliminary and upon coder review may  be revised to meet current compliance requirements. Elon Alas. Abbey Chatters,  DO [place] Abbey Chatters, DO 12/16/2020 1:14:00 PM This report has been signed electronically. Number of Addenda: 0

## 2020-12-16 NOTE — Transfer of Care (Signed)
Immediate Anesthesia Transfer of Care Note  Patient: Crystal Merritt  Procedure(s) Performed: COLONOSCOPY WITH PROPOFOL ESOPHAGOGASTRODUODENOSCOPY (EGD) WITH PROPOFOL BIOPSY POLYPECTOMY  Patient Location: Short Stay  Anesthesia Type:General  Level of Consciousness: awake, alert  and oriented  Airway & Oxygen Therapy: Patient Spontanous Breathing  Post-op Assessment: Report given to RN, Post -op Vital signs reviewed and stable and Patient moving all extremities X 4  Post vital signs: Reviewed and stable  Last Vitals:  Vitals Value Taken Time  BP    Temp 36.7 C 12/16/20 1336  Pulse 86 12/16/20 1338  Resp 18 12/16/20 1338  SpO2 100 % 12/16/20 1338    Last Pain:  Vitals:   12/16/20 1338  TempSrc: Oral  PainSc: 0-No pain      Patients Stated Pain Goal: 8 (24/23/53 6144)  Complications: No notable events documented.

## 2020-12-16 NOTE — Anesthesia Preprocedure Evaluation (Addendum)
Anesthesia Evaluation  Patient identified by MRN, date of birth, ID band Patient awake    Reviewed: Allergy & Precautions, NPO status , Patient's Chart, lab work & pertinent test results  Airway Mallampati: III  TM Distance: >3 FB Neck ROM: Full    Dental  (+) Dental Advisory Given   Pulmonary sleep apnea , former smoker,    Pulmonary exam normal breath sounds clear to auscultation       Cardiovascular Exercise Tolerance: Good hypertension, Pt. on medications Normal cardiovascular exam Rhythm:Regular Rate:Normal     Neuro/Psych  Headaches, PSYCHIATRIC DISORDERS Anxiety Depression  Neuromuscular disease    GI/Hepatic Neg liver ROS, GERD  Medicated,  Endo/Other  diabetes, Well Controlled, Type 2  Renal/GU negative Renal ROS     Musculoskeletal  (+) Fibromyalgia -  Abdominal   Peds  Hematology  (+) Blood dyscrasia, anemia ,   Anesthesia Other Findings Hypokalemia   Reproductive/Obstetrics                           Anesthesia Physical Anesthesia Plan  ASA: 3  Anesthesia Plan: General   Post-op Pain Management: Minimal or no pain anticipated   Induction:   PONV Risk Score and Plan: TIVA  Airway Management Planned: Nasal Cannula and Natural Airway  Additional Equipment:   Intra-op Plan:   Post-operative Plan:   Informed Consent: I have reviewed the patients History and Physical, chart, labs and discussed the procedure including the risks, benefits and alternatives for the proposed anesthesia with the patient or authorized representative who has indicated his/her understanding and acceptance.     Dental advisory given  Plan Discussed with: CRNA and Surgeon  Anesthesia Plan Comments:       Anesthesia Quick Evaluation

## 2020-12-16 NOTE — Anesthesia Procedure Notes (Signed)
Date/Time: 12/16/2020 1:17 PM Performed by: Orlie Dakin, CRNA Pre-anesthesia Checklist: Patient identified, Emergency Drugs available, Suction available and Patient being monitored Patient Re-evaluated:Patient Re-evaluated prior to induction Oxygen Delivery Method: Nasal cannula Induction Type: IV induction Placement Confirmation: positive ETCO2

## 2020-12-17 LAB — SURGICAL PATHOLOGY

## 2020-12-17 NOTE — Anesthesia Postprocedure Evaluation (Signed)
Anesthesia Post Note  Patient: Crystal Merritt  Procedure(s) Performed: COLONOSCOPY WITH PROPOFOL ESOPHAGOGASTRODUODENOSCOPY (EGD) WITH PROPOFOL BIOPSY POLYPECTOMY  Patient location during evaluation: PACU Anesthesia Type: General Level of consciousness: awake and alert and oriented Pain management: pain level controlled Vital Signs Assessment: post-procedure vital signs reviewed and stable Respiratory status: spontaneous breathing, nonlabored ventilation and respiratory function stable Cardiovascular status: blood pressure returned to baseline and stable Postop Assessment: no apparent nausea or vomiting Anesthetic complications: no   No notable events documented.   Last Vitals:  Vitals:   12/16/20 1338 12/16/20 1339  BP:  109/69  Pulse: 86   Resp: 18   Temp:  36.4 C  SpO2: 100%     Last Pain:  Vitals:   12/16/20 1338  TempSrc: Oral  PainSc: 0-No pain                 Nikolaj Geraghty C Sonu Kruckenberg

## 2020-12-19 ENCOUNTER — Encounter (HOSPITAL_COMMUNITY): Payer: Self-pay | Admitting: Internal Medicine

## 2021-03-12 ENCOUNTER — Telehealth (INDEPENDENT_AMBULATORY_CARE_PROVIDER_SITE_OTHER): Payer: BC Managed Care – PPO | Admitting: Neurology

## 2021-03-12 ENCOUNTER — Encounter: Payer: Self-pay | Admitting: Neurology

## 2021-03-12 ENCOUNTER — Other Ambulatory Visit: Payer: Self-pay

## 2021-03-12 VITALS — Ht 65.0 in | Wt 230.0 lb

## 2021-03-12 DIAGNOSIS — G43919 Migraine, unspecified, intractable, without status migrainosus: Secondary | ICD-10-CM

## 2021-03-12 DIAGNOSIS — G932 Benign intracranial hypertension: Secondary | ICD-10-CM

## 2021-03-12 MED ORDER — NURTEC 75 MG PO TBDP: 75.0000 mg | ORAL_TABLET | ORAL | 5 refills | Status: DC | PRN

## 2021-03-12 MED ORDER — ACETAZOLAMIDE 250 MG PO TABS
500.0000 mg | ORAL_TABLET | Freq: Two times a day (BID) | ORAL | 3 refills | Status: DC
Start: 2021-03-12 — End: 2021-03-20

## 2021-03-12 NOTE — Progress Notes (Signed)
? ?  Virtual Visit via Video Note ?The purpose of this virtual visit is to provide medical care while limiting exposure to the novel coronavirus.   ? ?Consent was obtained for video visit:  Yes.   ?Answered questions that patient had about telehealth interaction:  Yes.   ?I discussed the limitations, risks, security and privacy concerns of performing an evaluation and management service by telemedicine. I also discussed with the patient that there may be a patient responsible charge related to this service. The patient expressed understanding and agreed to proceed. ? ?Pt location: Home ?Physician Location: office ?Name of referring provider:  Monico Blitz, MD ?I connected with Lenora Boys at patients initiation/request on 03/12/2021 at  7:50 AM EST by video enabled telemedicine application and verified that I am speaking with the correct person using two identifiers. ?Pt MRN:  [id number] ?Pt DOB:  [birth year] ?Video Participants:  DESIRRE EICKHOFF ? ? ?History of Present Illness: This is a 51 y.o. female returning for follow-up of pseudotumor cerebri and migraines.  She was previously on carbamazepine for chronic migraine with right facial paresthesias, however, this was stopped in the fall because of improved headaches.  Since stopping this, her memory changes and brain fog has improved.  She did undergo neuropsychological testing since her last visit which was normal.   ? ?Over the past several weeks, she has been having migraines about twice per week.  She takes NSAIDs but it does not provide relief.  She takes diamox 500mg  twice daily.  She recently had eyes check, no evidence of papilledema. ? ?She takes gabapentin 200mg  at bedtime for fibromyalgia and chronic back pain.  ? ?Observations/Objective:   ?Vitals:  ? 03/12/21 0743  ?Weight: 230 lb (104.3 kg)  ?Height: 5\' 5"  (1.651 m)  ? ?Patient is awake, alert, and appears comfortable.  Oriented x 4.   ?Extraocular muscles are intact. No ptosis.  Face is symmetric.   Speech is not dysarthric.  ?Antigravity in all extremities.   ?Gait appears normal. ? ? ?Assessment and Plan:  ?Pseudotumor cerebri - stable without papilledema on recent eye exam.  Unable to perform fundoscopic exam today. She denies any new visual complaints. ?- Continue Diamox 500mg  BID ? ?2.  Episodic migraine ? - Previously tried imitrex (ineffective) ? - Start nurtec 75mg  as needed for severe migraine ? ?3.  Chronic migraine with right facial paresthesias, improved.  ? - Since stopped carbamazepine her memory changes have also improved. ? ?Follow Up Instructions: ?  ?I discussed the assessment and treatment plan with the patient. The patient was provided an opportunity to ask questions and all were answered. The patient agreed with the plan and demonstrated an understanding of the instructions. ?  ?The patient was advised to call back or seek an in-person evaluation if the symptoms worsen or if the condition fails to improve as anticipated. ? ?Follow-up in 1 year ? ? ?Alda Berthold, DO ? ?

## 2021-03-20 ENCOUNTER — Telehealth: Payer: Self-pay | Admitting: Neurology

## 2021-03-20 MED ORDER — ACETAZOLAMIDE 250 MG PO TABS: 500.0000 mg | ORAL_TABLET | Freq: Two times a day (BID) | ORAL | 3 refills | Status: DC

## 2021-03-20 MED ORDER — NURTEC 75 MG PO TBDP: 75.0000 mg | ORAL_TABLET | ORAL | 5 refills | Status: DC | PRN

## 2021-03-20 NOTE — Telephone Encounter (Signed)
Patient stated that at her last visit she was given a new prescription and a refill for acetazolamide.  She [email or web address]  She hasnt received them. ?

## 2021-03-20 NOTE — Telephone Encounter (Signed)
Spoke to patient and she has provided me with the correct pharmacy "[facility]" I have added new pharmacy and re-sent patients rx's Diamox and Nurtec.  ?

## 2021-03-21 ENCOUNTER — Telehealth: Payer: Self-pay | Admitting: Neurology

## 2021-03-21 MED ORDER — NURTEC 75 MG PO TBDP: 75.0000 mg | ORAL_TABLET | ORAL | 5 refills | Status: DC | PRN

## 2021-03-21 NOTE — Telephone Encounter (Signed)
Pt called and is stated that it was the nurtec the mail order pharmacy doesn't have but they did have her acetazolamide. Pt Nurtec was sent to [facility] in [place] as requested  ?

## 2021-03-21 NOTE — Telephone Encounter (Signed)
1. Which medications need refilled? (List name and dosage, if known) ? Name of new medication ? ?2. Which pharmacy/location is medication to be sent to? (include street and city if Management consultant) [facility] on [address]. In [place] ? ?3. Do they need a 30 day or 90 day supply? Depends on cost ? ?Patient stated the pharmacy the medication was sent to yesterday only filled one of the medications, not sure which one. ? ?Patient states she'll have to pay out of pocket. ?

## 2021-03-21 NOTE — Telephone Encounter (Signed)
Patient was asked but not able to provide the name of the medication or the pharmacy name sent to. ?

## 2022-03-11 ENCOUNTER — Encounter: Payer: Self-pay | Admitting: Neurology

## 2022-03-11 ENCOUNTER — Ambulatory Visit (INDEPENDENT_AMBULATORY_CARE_PROVIDER_SITE_OTHER): Payer: 59 | Admitting: Neurology

## 2022-03-11 VITALS — BP 138/83 | HR 64 | Ht 65.0 in | Wt 229.0 lb

## 2022-03-11 DIAGNOSIS — G932 Benign intracranial hypertension: Secondary | ICD-10-CM

## 2022-03-11 DIAGNOSIS — G43909 Migraine, unspecified, not intractable, without status migrainosus: Secondary | ICD-10-CM

## 2022-03-11 MED ORDER — ACETAZOLAMIDE 250 MG PO TABS: ORAL_TABLET | ORAL | 3 refills | Status: DC

## 2022-03-11 NOTE — Progress Notes (Signed)
Follow-up Visit   Date: [id number]    Crystal Merritt MRN: [id number] DOB: 1970/08/01   Interim History: Crystal Merritt is a 52 y.o. 52 y.o. right-handed African [place] female with hypertension, migraines, fibromyalgia, diabetes mellitus, GERD, depression and anxiety returning to the clinic for follow-up of migraines and pseudotumor cerebri.    IMPRESSION/PLAN: Pseudotumor cerebri, stable.  No papilledema on eye exam last month.  Symptoms have been very well controlled and she would like to taper medication, which is reasonable.   - Reduce Diamox to 530m in the morning and 2518mat bedtime  - Monitor for new vision changes or worsening headaches  - Praised her weight loss and encouraged lifestyle modification as she he doing   2.  Episodic migraine              - Previously tried imitrex (ineffective)              - OK to excedrin migraine as needed.  Limit to twice per week   3.  Chronic migraine with right facial paresthesias, improved and off carbamazepine   Return to clinic in 4 months  ----------------------------------------------------------- History of present illness: Since around ~2010, she began having pressure over the right cheek which was initially treated for sinusitis.  Symptoms would come and go, but for the past year, the pain become more constant.  Pain is always located on the over the right cheek, described as a tooth ache. She does not have electrical shock-like, stabbing, or shooting pain.     She also has long history of migraines since being a teenager.  She reports having daily headaches previously and lasting all day. Triggers include stress, bright lights, and smell of smoke.  She has tried imitrex and topiramate without any relief.  She was started on carbmazepine 10074maily two weeks ago and has noticed 25% improvement.  Since taking carbmazpeine, headaches now occur about twice per week and are less intense.  Her facial pain has resolved on  carbamazepine to 100m71mice daily.  In the spring of 2018, she taper carbamazepine to 100mg51mly and several months later, started having worsening pain.   MRI brain which showed prominent optic nerve sheaths and was evaluated for intracranial hypertension with LP.  Opening pressure was 21cm H20 and CSF glucose was elevated 114.  Patient had been off her diabetic medication for several months. Although her pressures are only mildly elevated, she continued to have retroorbital pain and was started on diamox 250mg 57me daily, which initially helped her headaches.  [facility] 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 Left eye is normal. Visual field testing was normal.  UPDATE 03/11/2020:  She is here for follow-up.  Her headaches were doing very well, occurring about 1-2 times per month, however, with the recent cooler temperature, headaches have been occurring 1-2 times per week.  She takes NSAIDs which helps.  She denies right facial pain, vision changes, numbness/tingling, or weakness.   UPDATE 03/11/2022:  She has been doing well and denies any new or worsening headaches.  She currently has migraines about 3-4 times per month, however, the changes in weather can trigger them to occur more frequently.  She treats the pain with OTC excedrin migraine which helps.  She had eye exam last month which was normal, no papilledema.  She would like to reduce the diamox in hopes to reduce her medications.  She  has lost >50lb and with diet and lifestyle changes.   Medications:  Current Outpatient Medications on File Prior to Visit  Medication Sig Dispense Refill   amLODipine (NORVASC) 10 MG tablet Take 10 mg by mouth daily.     aspirin 81 MG tablet Take 81 mg by mouth daily.     atorvastatin (LIPITOR) 40 MG tablet Take 40 mg by mouth at bedtime.     folic acid (FOLVITE) [id number] MCG tablet Take 400 mcg by mouth daily.     gabapentin  (NEURONTIN) 100 MG capsule Take 300 mg by mouth at bedtime as needed (pain). Take 316m at bedtime     JARDIANCE 25 MG TABS tablet Take 25 mg by mouth daily.     losartan-hydrochlorothiazide (HYZAAR) 100-25 MG tablet Take 1 tablet by mouth daily.     metFORMIN (GLUCOPHAGE) 500 MG tablet Take 1,000 mg by mouth daily with breakfast. Take two tablets twice daily     methocarbamol (ROBAXIN) 500 MG tablet Take 500 mg by mouth every 6 (six) hours as needed for muscle spasms.     ondansetron (ZOFRAN-ODT) 4 MG disintegrating tablet Take 4 mg by mouth every 8 (eight) hours as needed for vomiting or nausea.     pantoprazole (PROTONIX) 40 MG tablet Take 40 mg by mouth daily.     potassium chloride SA (KLOR-CON M) 20 MEQ tablet Take 2 tablets (40 mEq total) by mouth 3 (three) times daily for 3 days. 18 tablet 0   promethazine (PHENERGAN) 25 MG tablet Take 25 mg by mouth every 6 (six) hours as needed for nausea or vomiting.     Vitamin D, Ergocalciferol, (DRISDOL) 1.25 MG (50000 UNIT) CAPS capsule Take 50,000 Units by mouth every Saturday.     No current facility-administered medications on file prior to visit.    Allergies:  Allergies  Allergen Reactions   Diclofenac Sodium     Whole body cramped     Vital Signs:  BP 138/83   Pulse 64   Ht 5' 5"$  (1.651 m)   Wt 229 lb (103.9 kg)   SpO2 99%   BMI 38.11 kg/m    Neurological Exam: MENTAL STATUS including orientation to time, place, person, recent and remote memory, attention span and concentration is normal.  CRANIAL NERVES: No papilledema on funduscopic exam.  No visual field defects.  Pupils equal round and reactive to light.  Normal conjugate, extra-ocular eye movements in all directions of gaze.  No ptosis. Face sensation intact. Face is symmetric.   MOTOR:  Motor strength is 5/5 in all extremities.    REFLEXES: Reflexes are 2+/4 throughout.  SENSORY: Vibration is intact throughout.  COORDINATION/GAIT:  Gait narrow based and stable.     Data: Labs 02/01/2015:  HbA1c 6.9 Labs 09/22/2014:  TSH 2.030, vitamin B12 589 Lab Results  Component Value Date   TSH 2.00 08/22/2020   Lab Results  Component Value Date   [id number]/11/2020    CSF 05/28/2016: R37 W1  G114 P24  OP 21cmH20  MRI brain 05/19/2016: Prominent optic nerve sheaths, possible flattening of the optic papillae, could represent a manifestation of idiopathic intracranial hypertension. Correlate clinically as a cause of headaches. Lumbar puncture could be helpful in further evaluation. Otherwise unremarkable cranial MRI. No cause seen for LEFT hemisensory defect.   Thank you for allowing me to participate in patient's care.  If I can answer any additional questions, I would be pleased to do so.    Sincerely,  Finian Helvey K. Posey Pronto, DO

## 2022-03-11 NOTE — Patient Instructions (Signed)
Reduce Diamox to '500mg'$  in the morning and '250mg'$  at bedtime  Return to clinic in 4 months

## 2022-03-12 ENCOUNTER — Encounter: Payer: Self-pay | Admitting: Radiology

## 2022-03-16 IMAGING — MR MR HEAD WO/W CM
12 series · 48 of 48 positions shown · IV contrast (20ml Multihance)
Comparison: MR head without contrast 05/19/2016

CLINICAL DATA: Short-term memory loss over the last 1.5 years. Head
injury 6 years ago.

EXAM:
MRI HEAD WITHOUT AND WITH CONTRAST
TECHNIQUE: Multiplanar, multiecho pulse sequences of the brain and surrounding
structures were obtained without and with intravenous contrast.
CONTRAST:  20mL MULTIHANCE GADOBENATE DIMEGLUMINE 529 MG/ML IV SOLN

[Series 2: T1 · sagittal · 5.0mm · 0.45mm/px · 1 of 25 slices shown]
[im 1/25]
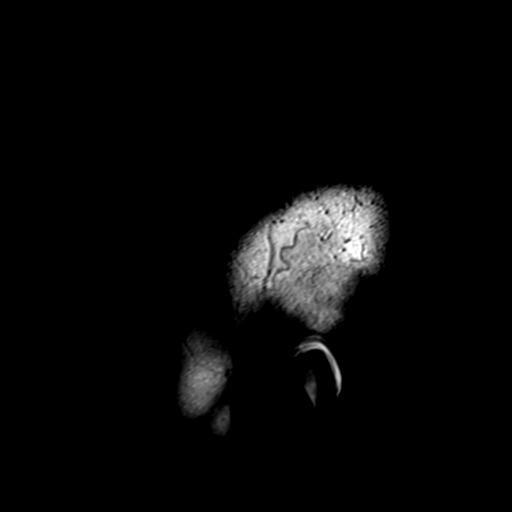

[Series 3: ax ep2d_diff_3 · axial · 3.0mm · 1.80mm/px · z∈[-110,+51]mm · 5 of 105 slices shown]
[im 1/105]
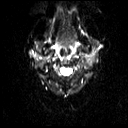
[im 27/105]
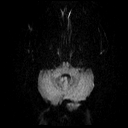
[im 53/105]
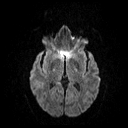
[im 79/105]
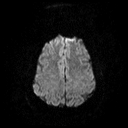
[im 105/105]
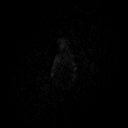

[Series 4: ax ep2d_diff_3_adc · axial · 3.0mm · 1.80mm/px · z∈[-110,+51]mm · 3 of 54 slices shown]
[im 1/54]
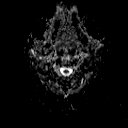
[im 27/54]
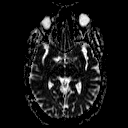
[im 54/54]
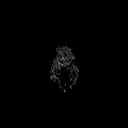

[Series 5: cor ep2d_diff · coronal · 5.0mm · 1.77mm/px · 4 of 60 slices shown]
[im 1/60]
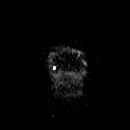
[im 20/60]
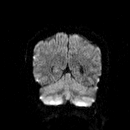
[im 40/60]
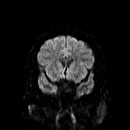
[im 60/60]
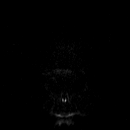

[Series 6: cor ep2d_diff_adc · coronal · 5.0mm · 1.77mm/px · 2 of 30 slices shown]
[im 1/30]
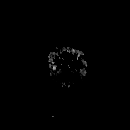
[im 30/30]
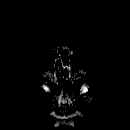

[Series 8: swi_images · axial · 2.0mm · 0.98mm/px · z∈[-109,+48]mm · 5 of 80 slices shown]
[im 1/80]
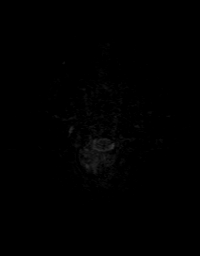
[im 20/80]
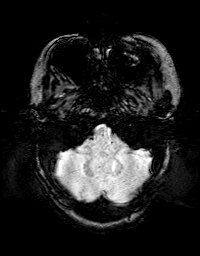
[im 40/80]
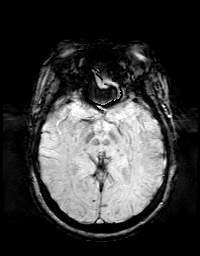
[im 60/80]
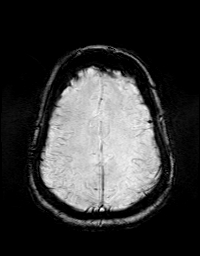
[im 80/80]
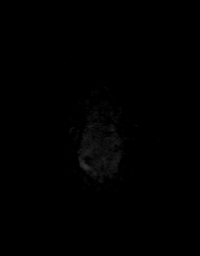

[Series 9: FLAIR · axial · 3.0mm · 0.43mm/px · z∈[-105,+47]mm · 2 of 40 slices shown]
[im 1/40]
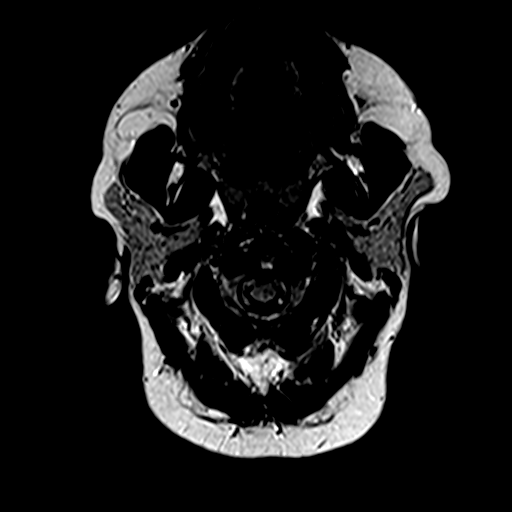
[im 40/40]
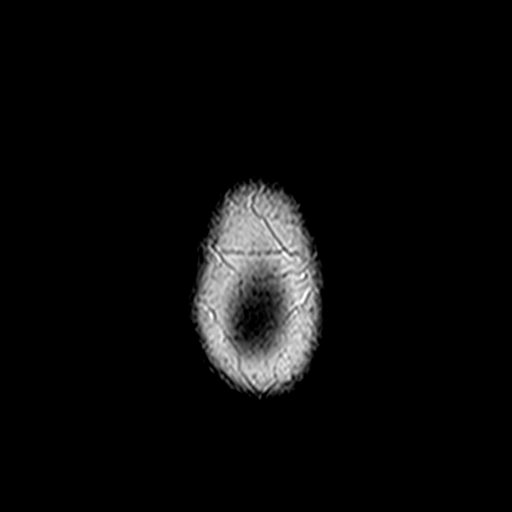

[Series 10: T2 · axial · 5.0mm · 0.65mm/px · z∈[-114,+53]mm · 2 of 29 slices shown (1 of 2)]
[im 1/29]
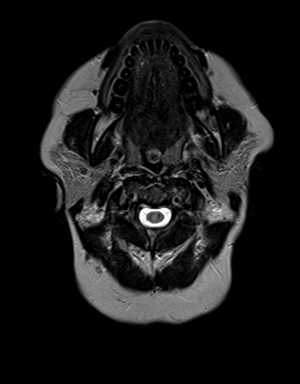
[im 29/29]
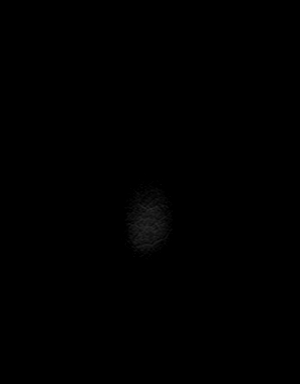

[Series 11: t1_mpr_tra · axial · 1.0mm · 0.72mm/px · z∈[-109,+50]mm · 10 of 160 slices shown]
[im 1/160]
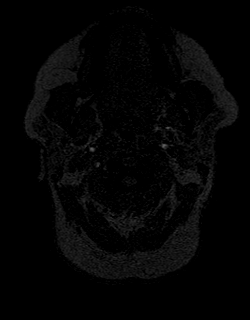
[im 18/160]
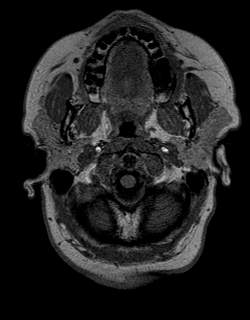
[im 36/160]
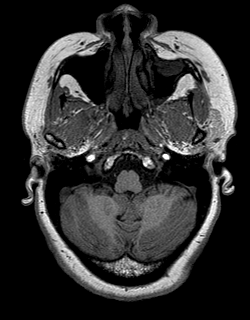
[im 54/160]
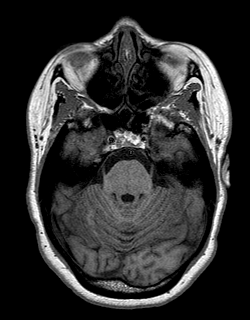
[im 71/160]
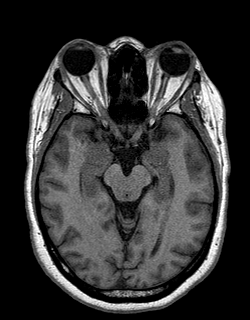
[im 89/160]
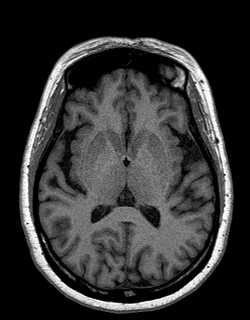
[im 107/160]
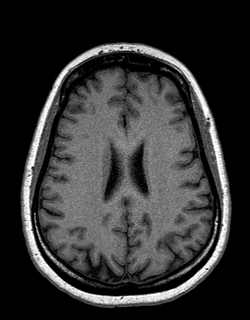
[im 124/160]
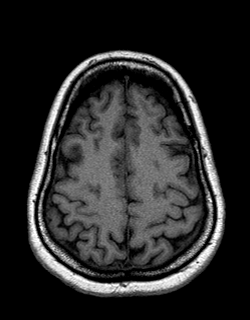
[im 142/160]
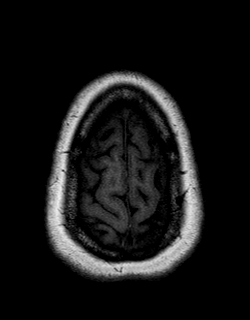
[im 160/160]
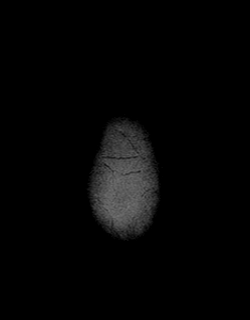

[Series 12: T2 · coronal · 5.0mm · 0.43mm/px · 2 of 28 slices shown (2 of 2)]
[im 1/28]
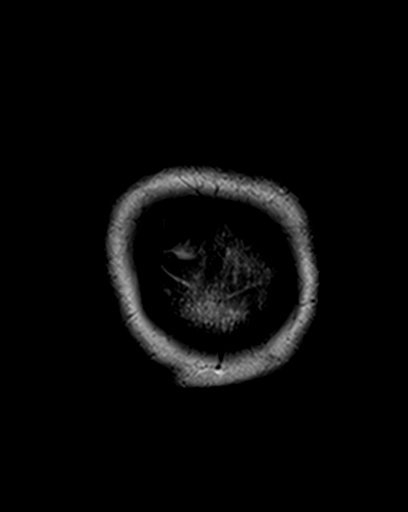
[im 28/28]
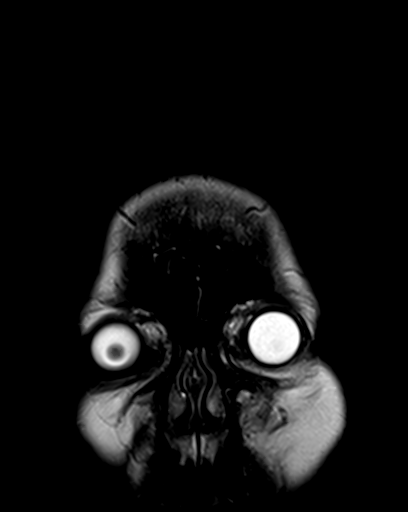

[Series 13: post t1_mpr_tra · axial · 1.0mm · 0.72mm/px · z∈[-109,+50]mm · 10 of 160 slices shown]
[im 1/160]
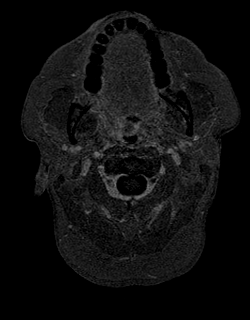
[im 18/160]
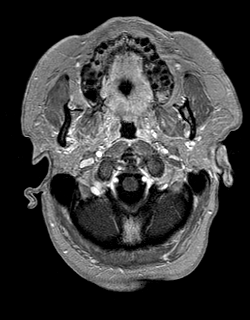
[im 36/160]
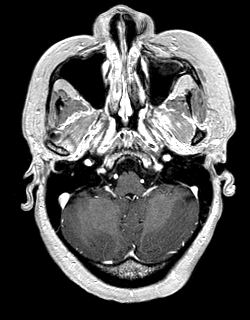
[im 54/160]
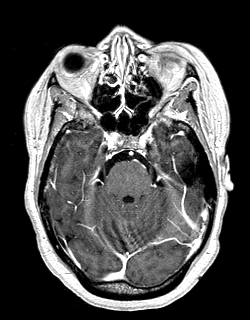
[im 71/160]
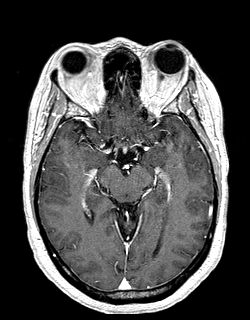
[im 89/160]
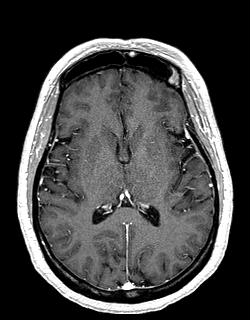
[im 107/160]
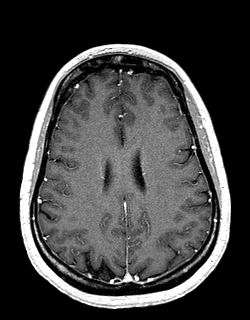
[im 124/160]
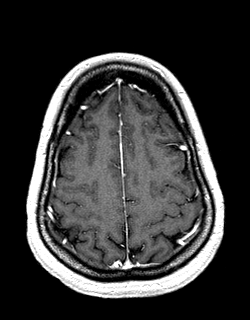
[im 142/160]
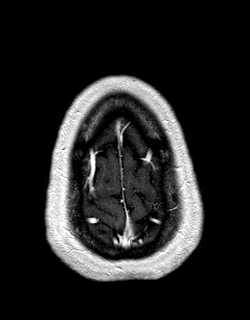
[im 160/160]
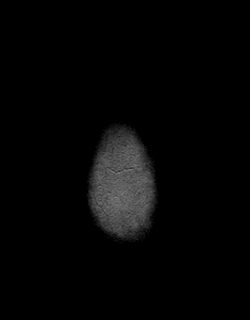

[Series 14: T1 post-contrast · coronal · 5.0mm · 0.72mm/px · 2 of 26 slices shown]
[im 1/26]
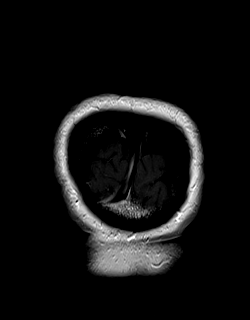
[im 26/26]
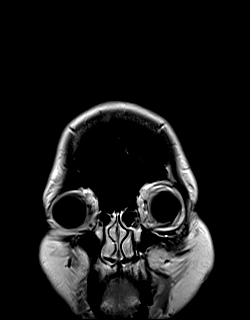

[48 of 48 positions shown; findings below may reference images not displayed]

FINDINGS: Brain: No acute infarct, hemorrhage, or mass lesion is present. No
significant white matter lesions are present. The ventricles are of
normal size. No significant extraaxial fluid collection is present.
Mild prominence of CSF ventral to the brainstem is again noted.

The internal auditory canals are within normal limits. The brainstem
and cerebellum are within normal limits.

Postcontrast images demonstrate no pathologic enhancement.

Vascular: Flow is present in the major intracranial arteries.

Skull and upper cervical spine: The craniocervical junction is
normal. Upper cervical spine is within normal limits. Marrow signal
is unremarkable.

Sinuses/Orbits: Mild prominence of fluid within the optic nerves and
flattening of the discs again noted. Globes and orbits are otherwise
within normal limits. Anterior polyp or mucous retention cyst is
present in the left maxillary sinus. A fluid level is present in the
left maxillary sinus is well. Mild mucosal thickening is noted
within ethmoid air cells. Minimal fluid is present posteriorly in
the mastoid air cells bilaterally. No obstructing nasopharyngeal
lesion is present.
IMPRESSION: 1. Stable prominence of CSF ventral to the brainstem and prominence
of the optic nerves and flattening of the discs. This is
nonspecific, but can be seen with idiopathic intracranial
hypertension.
2. Otherwise normal MRI appearance of the brain. No acute or focal
lesion to explain memory loss.
3. Acute on chronic left maxillary sinus disease.

## 2022-07-20 ENCOUNTER — Encounter: Payer: Self-pay | Admitting: Neurology

## 2022-07-20 ENCOUNTER — Ambulatory Visit (INDEPENDENT_AMBULATORY_CARE_PROVIDER_SITE_OTHER): Payer: BC Managed Care – PPO | Admitting: Neurology

## 2022-07-20 VITALS — BP 126/85 | HR 70 | Ht 65.0 in | Wt 242.0 lb

## 2022-07-20 DIAGNOSIS — M542 Cervicalgia: Secondary | ICD-10-CM

## 2022-07-20 DIAGNOSIS — G43919 Migraine, unspecified, intractable, without status migrainosus: Secondary | ICD-10-CM | POA: Diagnosis not present

## 2022-07-20 DIAGNOSIS — G932 Benign intracranial hypertension: Secondary | ICD-10-CM

## 2022-07-20 NOTE — Patient Instructions (Addendum)
Start [facility] in [place]   Reduce diamox to 250mg  twice daily  Please have eye checked prior to next visit  Return to clinic in 4 months

## 2022-07-20 NOTE — Progress Notes (Signed)
Follow-up Visit   Date: 07/20/22    Crystal Merritt MRN: [id number] DOB: 1970-08-20   Interim History: Crystal Merritt is a 52 y.o. 52 y.o. right-handed African [place] female with hypertension, migraines, fibromyalgia, diabetes mellitus, GERD, depression and anxiety returning to the clinic for follow-up of migraines and pseudotumor cerebri.    IMPRESSION/PLAN: Pseudotumor cerebri, stable. No papilledema today.  Symptoms have been very well controlled.  - Reduce diamox to 250mg  twice daily  - Monitor for new vision changes or worsening headaches  - Weight loss encouraged, she has regained the weight that she previously lost   2.  Episodic migraine              - Previously tried imitrex (ineffective)              - OK to excedrin migraine as needed.  Limit to twice per week   3.  Chronic migraine with right facial paresthesias, improved.  Off carbamazepine  4. Cervicalgia - new.  No exam findings of myelopathy or radiculopathy.  - Start neck PT   Return to clinic in 4 months  ----------------------------------------------------------- History of present illness: Since around ~2010, she began having pressure over the right cheek which was initially treated for sinusitis.  Symptoms would come and go, but for the past year, the pain become more constant.  Pain is always located on the over the right cheek, described as a tooth ache. She does not have electrical shock-like, stabbing, or shooting pain.     She also has long history of migraines since being a teenager.  She reports having daily headaches previously and lasting all day. Triggers include stress, bright lights, and smell of smoke.  She has tried imitrex and topiramate without any relief.  She was started on carbmazepine 100mg  daily two weeks ago and has noticed 25% improvement.  Since taking carbmazpeine, headaches now occur about twice per week and are less intense.  Her facial pain has resolved on carbamazepine to  100mg  twice daily.  In the spring of 2018, she taper carbamazepine to 100mg  daily and several months later, started having worsening pain.   MRI brain which showed prominent optic nerve sheaths and was evaluated for intracranial hypertension with LP.  Opening pressure was 21cm H20 and CSF glucose was elevated 114.  Patient had been off her diabetic medication for several months. Although her pressures are only mildly elevated, she continued to have retroorbital pain and was started on diamox 250mg  twice daily, which initially helped her headaches.  Dr. Merril Abbe with [facility] dated 06/17/2016. 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 Left eye is normal. Visual field testing was normal.  UPDATE 03/11/2020:  She is here for follow-up.  Her headaches were doing very well, occurring about 1-2 times per month, however, with the recent cooler temperature, headaches have been occurring 1-2 times per week.  She takes NSAIDs which helps.  She denies right facial pain, vision changes, numbness/tingling, or weakness.   UPDATE 03/11/2022:  She has been doing well and denies any new or worsening headaches.  She currently has migraines about 3-4 times per month, however, the changes in weather can trigger them to occur more frequently.  She treats the pain with OTC excedrin migraine which helps.  She had eye exam last month which was normal, no papilledema.  She would like to reduce the diamox in hopes to reduce her medications.  She has lost >50lb  and with diet and lifestyle changes.   UPDATE 07/20/2022:  She is here for follow-up visit.  She continues to get headaches about 2 twice per week which tends to be responsive to Excedrin. No new vision changes or worsening headaches.  She does report having neck pain and stiffness and is wondering if this could be contributing to her pain.  She was able to reduce Diamox to 750mg /d and denies headaches and vision changes.    Medications:  Current Outpatient Medications on File Prior to Visit  Medication Sig Dispense Refill   amLODipine (NORVASC) 10 MG tablet Take 10 mg by mouth daily.     aspirin 81 MG tablet Take 81 mg by mouth daily.     atorvastatin (LIPITOR) 40 MG tablet Take 40 mg by mouth at bedtime.     folic acid (FOLVITE) 400 MCG tablet Take 400 mcg by mouth daily.     gabapentin (NEURONTIN) 100 MG capsule Take 300 mg by mouth at bedtime as needed (pain). Take 300mg  at bedtime     losartan-hydrochlorothiazide (HYZAAR) 100-25 MG tablet Take 1 tablet by mouth daily.     metFORMIN (GLUCOPHAGE) 500 MG tablet Take 1,000 mg by mouth daily with breakfast. Take two tablets twice daily     methocarbamol (ROBAXIN) 500 MG tablet Take 500 mg by mouth every 6 (six) hours as needed for muscle spasms.     ondansetron (ZOFRAN-ODT) 4 MG disintegrating tablet Take 4 mg by mouth every 8 (eight) hours as needed for vomiting or nausea.     pantoprazole (PROTONIX) 40 MG tablet Take 40 mg by mouth daily.     potassium chloride SA (KLOR-CON M) 20 MEQ tablet Take 2 tablets (40 mEq total) by mouth 3 (three) times daily for 3 days. 18 tablet 0   promethazine (PHENERGAN) 25 MG tablet Take 25 mg by mouth every 6 (six) hours as needed for nausea or vomiting.     Vitamin D, Ergocalciferol, (DRISDOL) 1.25 MG (50000 UNIT) CAPS capsule Take 50,000 Units by mouth every Saturday.     acetaZOLAMIDE (DIAMOX) 250 MG tablet Take 2 tablet in the morning 1 tablet at bedtime. (Patient not taking: Reported on 07/20/2022) 270 tablet 3   JARDIANCE 25 MG TABS tablet Take 25 mg by mouth daily. (Patient not taking: Reported on 07/20/2022)     No current facility-administered medications on file prior to visit.    Allergies:  Allergies  Allergen Reactions   Diclofenac Sodium     Whole body cramped     Vital Signs:  BP 126/85   Pulse 70   Ht 5\' 5"  (1.651 m)   Wt 242 lb (109.8 kg)   SpO2 100%   BMI 40.27 kg/m    Neurological Exam: MENTAL  STATUS including orientation to time, place, person, recent and remote memory, attention span and concentration is normal.  CRANIAL NERVES: No papilledema on funduscopic exam.  No visual field defects.  Pupils equal round and reactive to light.  Normal conjugate, extra-ocular eye movements in all directions of gaze.  No ptosis.  Face is symmetric.   MOTOR:  Motor strength is 5/5 in all extremities.    REFLEXES: Reflexes are 2+/4 throughout.  SENSORY: Vibration is intact throughout.  COORDINATION/GAIT:  Gait narrow based and stable.    Data: Labs 02/01/2015:  HbA1c 6.9 Labs 09/22/2014:  TSH 2.030, vitamin B12 589 Lab Results  Component Value Date   TSH 2.00 08/22/2020   Lab Results  Component Value Date  [id number] 622 08/22/2020   CSF 05/28/2016: R37 W1  G114 P24  OP 21cmH20  MRI brain 05/19/2016: Prominent optic nerve sheaths, possible flattening of the optic papillae, could represent a manifestation of idiopathic intracranial hypertension. Correlate clinically as a cause of headaches. Lumbar puncture could be helpful in further evaluation. Otherwise unremarkable cranial MRI. No cause seen for LEFT hemisensory defect.   Thank you for allowing me to participate in patient's care.  If I can answer any additional questions, I would be pleased to do so.    Sincerely,    Crystal Habermehl K. Allena Katz, DO

## 2022-09-11 ENCOUNTER — Telehealth: Payer: Self-pay | Admitting: Neurology

## 2022-09-11 NOTE — Telephone Encounter (Signed)
Pt has been called and schedule as advised by provider.

## 2022-09-11 NOTE — Telephone Encounter (Signed)
OK to schedule 9/4 at 10:30a.  I have not received notes from her eye doctor, so see if she can have them faxed before her visit.

## 2022-09-11 NOTE — Telephone Encounter (Signed)
Pt is calling in stating the she went to her eye doctor on Thursday 09/10/2022 and they suggested that she be seen before her next scheduled appt 11/24/22 @ 10:50 w/Dr. Allena Katz.  Pt also stated that she has a report that the eye doctor wanted her to present to Dr. Allena Katz.

## 2022-09-11 NOTE — Telephone Encounter (Signed)
Pt is calling in stating that she is not able to come in due to transportation (her husband has to work) and was offered a Tax inspector.  Per the provider she was needing to see the pt in the office, but if she is able to get the eye doctors office notes she will be able to do it virtually.  Pt is to call the office on 09/15/2022 (Tuesday) to see if we have rec'd the eye doctors office notes if not it is a possibility that the appt has to be rescheduled per the provider (Dr. Allena Katz).  Pt verbalized understanding and will give the office a call as stated above.

## 2022-09-15 ENCOUNTER — Telehealth: Payer: Self-pay | Admitting: Neurology

## 2022-09-15 NOTE — Telephone Encounter (Signed)
Patient states that she is living in [place] and doesn't have anyone to bring her here and asked for a VV. I did tell her that since she lives in [place] we may not be able too.

## 2022-09-15 NOTE — Telephone Encounter (Signed)
Please let pt know that we will keep her appt as virtual visit.

## 2022-09-16 ENCOUNTER — Telehealth: Payer: BC Managed Care – PPO | Admitting: Neurology

## 2022-09-16 VITALS — Ht 65.0 in | Wt 245.0 lb

## 2022-09-16 DIAGNOSIS — G932 Benign intracranial hypertension: Secondary | ICD-10-CM

## 2022-09-16 MED ORDER — ACETAZOLAMIDE 250 MG PO TABS: 500.0000 mg | ORAL_TABLET | Freq: Two times a day (BID) | ORAL | 3 refills | Status: DC

## 2022-09-16 NOTE — Progress Notes (Signed)
   Virtual Visit via Video Note The purpose of this virtual visit is to provide medical care while limiting exposure to the novel coronavirus.    Consent was obtained for video visit:  Yes.   Answered questions that patient had about telehealth interaction:  Yes.   I discussed the limitations, risks, security and privacy concerns of performing an evaluation and management service by telemedicine. I also discussed with the patient that there may be a patient responsible charge related to this service. The patient expressed understanding and agreed to proceed.  Pt location: Home Physician Location: office Name of referring provider:  Toma Deiters, MD I connected with Crystal Merritt at patients initiation/request on 09/16/2022 at 10:30 AM EDT by video enabled telemedicine application and verified that I am speaking with the correct person using two identifiers. Pt MRN:  [id number] Pt DOB:  11-17-1970 Video Participants:  Crystal Merritt   History of Present Illness: This is a 52 y.o. female returning for follow-up of pseudotumor cerebri. At her last visit, she was doing very well so recommended to reduce diamox to 250mg  twice daily. A week later, she began having worsening headaches, but did not notify me of this change. She went to see her eye doctor who noted mild optic nerve edema with slightly larger blind sport bilaterally.  Eye doctor recommended that she follow-up with me.  Patient was unable to coordinate transportation for in-office evaluation.    Observations/Objective:   Vitals:   09/16/22 1037  Weight: 245 lb (111.1 kg)  Height: 5\' 5"  (1.651 m)   Patient is awake, alert, and appears comfortable.  Oriented x 4.   Extraocular muscles are intact. No ptosis.  Face is symmetric.  Speech is not dysarthric.  Antigravity in all extremities.    Assessment and Plan:  Pseudotumor cerebri, worsening with mild bilateral papilledema based on notes from eye care specialist. She also  reports worsening headaches since lowering the dose of diamox to 250mg  BID.  I recommend that she increase diamox back to 500mg  BID.  I have asked her to provide an update in 1 week, if she continues to have headaches, we either titrate medication further or consider large volume LP.   Follow Up Instructions:   I discussed the assessment and treatment plan with the patient. The patient was provided an opportunity to ask questions and all were answered. The patient agreed with the plan and demonstrated an understanding of the instructions.   The patient was advised to call back or seek an in-person evaluation if the symptoms worsen or if the condition fails to improve as anticipated.  Follow-up in 2 months, or sooner as needed   Glendale Chard, DO

## 2022-09-29 ENCOUNTER — Encounter: Payer: Self-pay | Admitting: Neurology

## 2022-11-24 ENCOUNTER — Ambulatory Visit (INDEPENDENT_AMBULATORY_CARE_PROVIDER_SITE_OTHER): Payer: BC Managed Care – PPO | Admitting: Neurology

## 2022-11-24 ENCOUNTER — Encounter: Payer: Self-pay | Admitting: Neurology

## 2022-11-24 VITALS — BP 118/80 | HR 77 | Ht 65.0 in | Wt 247.0 lb

## 2022-11-24 DIAGNOSIS — G932 Benign intracranial hypertension: Secondary | ICD-10-CM | POA: Diagnosis not present

## 2022-11-24 NOTE — Patient Instructions (Signed)
Increase Diamox to 750mg  twice daily  Reduce Excedrin to twice per week

## 2022-11-24 NOTE — Progress Notes (Signed)
Follow-up Visit   Date: 11/24/22    Crystal Merritt MRN: [id number] DOB: Feb 12, 1970   Interim History: Crystal Merritt is a 52 y.o. 52 y.o. right-handed [place] female with hypertension, migraines, fibromyalgia, diabetes mellitus, GERD, depression and anxiety returning to the clinic for follow-up of migraines and pseudotumor cerebri.    IMPRESSION/PLAN: Pseudotumor cerebri, worsening headaches.  No papilledema.  - Increase diamox to 750mg  twice daily  - Reduce excedrin to twice per week as she is at risk for medication overuse headaches   2.  Episodic migraine              - Previously tried imitrex (ineffective)              - OK to excedrin migraine as needed.  Limit to twice per week   3.  Chronic migraine with right facial paresthesias, improved.  Off carbamazepine   Return to clinic in 4 months  ----------------------------------------------------------- History of present illness: Since around ~2010, she began having pressure over the right cheek which was initially treated for sinusitis.  Symptoms would come and go, but for the past year, the pain become more constant.  Pain is always located on the over the right cheek, described as a tooth ache. She does not have electrical shock-like, stabbing, or shooting pain.     She also has long history of migraines since being a teenager.  She reports having daily headaches previously and lasting all day. Triggers include stress, bright lights, and smell of smoke.  She has tried imitrex and topiramate without any relief.  She was started on carbmazepine 100mg  daily two weeks ago and has noticed 25% improvement.  Since taking carbmazpeine, headaches now occur about twice per week and are less intense.  Her facial pain has resolved on carbamazepine to 100mg  twice daily.  In the spring of 2018, she taper carbamazepine to 100mg  daily and several months later, started having worsening pain.   MRI brain which showed prominent  optic nerve sheaths and was evaluated for intracranial hypertension with LP.  Opening pressure was 21cm H20 and CSF glucose was elevated 114.  Patient had been off her diabetic medication for several months. Although her pressures are only mildly elevated, she continued to have retroorbital pain and was started on diamox 250mg  twice daily, which initially helped her headaches.  Dr. Merril Abbe with [facility] dated 06/17/2016. 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 Left eye is normal. Visual field testing was normal.  UPDATE 03/11/2020:  She is here for follow-up.  Her headaches were doing very well, occurring about 1-2 times per month, however, with the recent cooler temperature, headaches have been occurring 1-2 times per week.  She takes NSAIDs which helps.  She denies right facial pain, vision changes, numbness/tingling, or weakness.   UPDATE 03/11/2022:  She has been doing well and denies any new or worsening headaches.  She currently has migraines about 3-4 times per month, however, the changes in weather can trigger them to occur more frequently.  She treats the pain with OTC excedrin migraine which helps.  She had eye exam last month which was normal, no papilledema.  She would like to reduce the diamox in hopes to reduce her medications.  She has lost >50lb and with diet and lifestyle changes.   UPDATE 07/20/2022:  She is here for follow-up visit.  She continues to get headaches about 2 twice per week which tends to  be responsive to Excedrin. No new vision changes or worsening headaches.  She does report having neck pain and stiffness and is wondering if this could be contributing to her pain.  She was able to reduce Diamox to 750mg /d and denies headaches and vision changes.   UPDATE 11/24/2022:  She is here for follow-up visit.  She has been taking diamox 500mg  twice daily since September.  Her headaches have been getting worse and she is having daily  headaches.  She was recommended to increase diamox to 750mg  twice daily, but did not make this change.  She is taking excedrin daily for the past several weeks.  She denies vision changes, but does report difficulty with night vision.   Medications:  Current Outpatient Medications on File Prior to Visit  Medication Sig Dispense Refill   acetaZOLAMIDE (DIAMOX) 250 MG tablet Take 2 tablets (500 mg total) by mouth 2 (two) times daily. 360 tablet 3   amLODipine (NORVASC) 10 MG tablet Take 10 mg by mouth daily.     aspirin 81 MG tablet Take 81 mg by mouth daily.     atorvastatin (LIPITOR) 40 MG tablet Take 40 mg by mouth at bedtime.     Boswellia-Glucosamine-Vit D (OSTEO BI-FLEX ONE PER DAY PO) Take by mouth. On hold     Calcium Citrate-Vitamin D (CALCIUM CITRATE MAXIMUM/VIT D PO) Take by mouth.     folic acid (FOLVITE) 400 MCG tablet Take 400 mcg by mouth daily.     gabapentin (NEURONTIN) 100 MG capsule Take 300 mg by mouth at bedtime as needed (pain). Take 300mg  at bedtime     losartan-hydrochlorothiazide (HYZAAR) 100-25 MG tablet Take 1 tablet by mouth daily.     metFORMIN (GLUCOPHAGE) 500 MG tablet Take 1,000 mg by mouth daily with breakfast. Take two tablets twice daily     methocarbamol (ROBAXIN) 500 MG tablet Take 500 mg by mouth every 6 (six) hours as needed for muscle spasms.     ondansetron (ZOFRAN-ODT) 4 MG disintegrating tablet Take 4 mg by mouth every 8 (eight) hours as needed for vomiting or nausea.     pantoprazole (PROTONIX) 40 MG tablet Take 40 mg by mouth daily.     potassium chloride SA (KLOR-CON M) 20 MEQ tablet Take 2 tablets (40 mEq total) by mouth 3 (three) times daily for 3 days. 18 tablet 0   promethazine (PHENERGAN) 25 MG tablet Take 25 mg by mouth every 6 (six) hours as needed for nausea or vomiting.     No current facility-administered medications on file prior to visit.    Allergies:  Allergies  Allergen Reactions   Diclofenac Sodium     Whole body cramped      Vital Signs:  BP 118/80   Pulse 77   Ht 5\' 5"  (1.651 m)   Wt 247 lb (112 kg)   SpO2 99%   BMI 41.10 kg/m    Neurological Exam: MENTAL STATUS including orientation to time, place, person, recent and remote memory, attention span and concentration is normal.  CRANIAL NERVES: No papilledema on funduscopic exam.  No visual field defects.  Pupils equal round and reactive to light.  Normal conjugate, extra-ocular eye movements in all directions of gaze.  No ptosis.  Face is symmetric.   MOTOR:  Motor strength is 5/5 in all extremities.    COORDINATION/GAIT:  Gait narrow based and stable.    Data: Labs 02/01/2015:  HbA1c 6.9 Labs 09/22/2014:  TSH 2.030, vitamin B12 589 Lab Results  Component Value Date   TSH 2.00 08/22/2020   Lab Results  Component Value Date   VITAMINB12 622 08/22/2020   CSF 05/28/2016: R37 W1  G114 P24  OP 21cmH20  MRI brain 05/19/2016: Prominent optic nerve sheaths, possible flattening of the optic papillae, could represent a manifestation of idiopathic intracranial hypertension. Correlate clinically as a cause of headaches. Lumbar puncture could be helpful in further evaluation. Otherwise unremarkable cranial MRI. No cause seen for LEFT hemisensory defect.   Thank you for allowing me to participate in patient's care.  If I can answer any additional questions, I would be pleased to do so.    Sincerely,    Naiyah Klostermann K. Allena Katz, DO

## 2023-02-01 ENCOUNTER — Telehealth: Payer: Self-pay | Admitting: Neurology

## 2023-02-01 NOTE — Telephone Encounter (Signed)
1. Which medications need to be refilled? (please list name of each medication and dose if known) acetaZOLAMIDE (DIAMOX) 250 MG tablet  2. Which pharmacy/location (including street and city if local pharmacy) is medication to be sent to? [facility] - [place], [place] - [address]  3. Do they need a 30 day or 90 day supply?

## 2023-02-02 MED ORDER — ACETAZOLAMIDE 250 MG PO TABS: ORAL_TABLET | ORAL | 3 refills | Status: DC

## 2023-02-02 NOTE — Telephone Encounter (Signed)
Patient is needing a new prescription with new dose 750 mg twice a day sent in. Informed patient we will get that sent in for her. Patient verbalized understanding.

## 2023-04-21 ENCOUNTER — Ambulatory Visit: Payer: BC Managed Care – PPO | Admitting: Neurology

## 2023-05-28 ENCOUNTER — Telehealth: Payer: Self-pay | Admitting: Neurology

## 2023-05-28 MED ORDER — ACETAZOLAMIDE 250 MG PO TABS: ORAL_TABLET | ORAL | 1 refills | Status: DC

## 2023-05-28 NOTE — Telephone Encounter (Signed)
Ok to send until f/u, thanks

## 2023-05-28 NOTE — Telephone Encounter (Signed)
 Called patient and left message per DPR that refill has been sent. Left contact information incase patient has any questions or concern.

## 2023-05-28 NOTE — Telephone Encounter (Signed)
 1. Which medications need refilled? (List name and dosage, if known) acetazolamide   2. Which pharmacy/location is medication to be sent to? 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

## 2023-06-30 ENCOUNTER — Ambulatory Visit: Admitting: Neurology

## 2023-08-03 ENCOUNTER — Encounter: Payer: Self-pay | Admitting: Neurology

## 2023-08-03 ENCOUNTER — Ambulatory Visit (INDEPENDENT_AMBULATORY_CARE_PROVIDER_SITE_OTHER): Admitting: Neurology

## 2023-08-03 VITALS — BP 120/82 | HR 68 | Ht 65.0 in | Wt 226.0 lb

## 2023-08-03 DIAGNOSIS — G932 Benign intracranial hypertension: Secondary | ICD-10-CM | POA: Diagnosis not present

## 2023-08-03 MED ORDER — ACETAZOLAMIDE 250 MG PO TABS: ORAL_TABLET | ORAL | 11 refills | Status: DC

## 2023-08-03 NOTE — Progress Notes (Signed)
 Follow-up Visit   Date: 08/03/23    Crystal Merritt MRN: [id number] DOB: [birth year]   Interim History: Crystal Merritt is a 53 y.o. 53 y.o. right-handed [place] female with hypertension, migraines, fibromyalgia, diabetes mellitus, GERD, depression and anxiety returning to the clinic for follow-up of migraines and pseudotumor cerebri.    IMPRESSION/PLAN: Pseudotumor cerebri, stable.  No papilledema.  - Continue diamox  750mg  twice daily  - Limit NSAIDs to twice per week  2.  Episodic migraine              - Previously tried imitrex (ineffective)              - OK to excedrin migraine as needed.  Limit to twice per week   3.  Chronic migraine with right facial paresthesias, improved.  Off carbamazepine    Return to clinic in 6 months  ----------------------------------------------------------- History of present illness: Since around ~2010, she began having pressure over the right cheek which was initially treated for sinusitis.  Symptoms would come and go, but for the past year, the pain become more constant.  Pain is always located on the over the right cheek, described as a tooth ache. She does not have electrical shock-like, stabbing, or shooting pain.     She also has long history of migraines since being a teenager.  She reports having daily headaches previously and lasting all day. Triggers include stress, bright lights, and smell of smoke.  She has tried imitrex and topiramate without any relief.  She was started on carbmazepine 100mg  daily two weeks ago and has noticed 25% improvement.  Since taking carbmazpeine, headaches now occur about twice per week and are less intense.  Her facial pain has resolved on carbamazepine  to 100mg  twice daily.  In the spring of 2018, she taper carbamazepine  to 100mg  daily and several months later, started having worsening pain.   MRI brain which showed prominent optic nerve sheaths and was evaluated for intracranial hypertension with  LP.  Opening pressure was 21cm H20 and CSF glucose was elevated 114.  Patient had been off her diabetic medication for several months. Although her pressures are only mildly elevated, she continued to have retroorbital pain and was started on diamox  250mg  twice daily, which initially helped her headaches.  Dr. Alric Boone with [facility] dated 06/17/2016. 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 Left eye is normal. Visual field testing was normal.  UPDATE 03/11/2020:  She is here for follow-up.  Her headaches were doing very well, occurring about 1-2 times per month, however, with the recent cooler temperature, headaches have been occurring 1-2 times per week.  She takes NSAIDs which helps.  She denies right facial pain, vision changes, numbness/tingling, or weakness.   UPDATE 03/11/2022:  She has been doing well and denies any new or worsening headaches.  She currently has migraines about 3-4 times per month, however, the changes in weather can trigger them to occur more frequently.  She treats the pain with OTC excedrin migraine which helps.  She had eye exam last month which was normal, no papilledema.  She would like to reduce the diamox  in hopes to reduce her medications.  She has lost >50lb and with diet and lifestyle changes.   UPDATE 07/20/2022:  She is here for follow-up visit.  She continues to get headaches about 2 twice per week which tends to be responsive to Excedrin. No new vision changes or worsening headaches.  She does report having neck pain and stiffness and is wondering if this could be contributing to her pain.  She was able to reduce Diamox  to 750mg /d and denies headaches and vision changes.   UPDATE 11/24/2022:  She is here for follow-up visit.  She has been taking diamox  500mg  twice daily since September.  Her headaches have been getting worse and she is having daily headaches.  She was recommended to increase diamox  to 750mg  twice daily, but  did not make this change.  She is taking excedrin daily for the past several weeks.  She denies vision changes, but does report difficulty with night vision.  UPDATE 08/03/2023:  She is here for follow-up visit.  She reports that headache frequency markedly improved after increasing dose of diamox  to 750mg  twice daily.  Now, she is getting headaches 3-4 times per month and takes excedrin which helps.  She is also having some issues with memory, but reports having poor sleep.  She remains independent and continues to perform IADLs and ADLs.    Medications:  Current Outpatient Medications on File Prior to Visit  Medication Sig Dispense Refill   acetaZOLAMIDE  (DIAMOX ) 250 MG tablet Take 3 tablets (750 mg total) by mouth two times a day. 180 tablet 1   amLODipine (NORVASC) 10 MG tablet Take 10 mg by mouth daily.     aspirin 81 MG tablet Take 81 mg by mouth daily.     atorvastatin (LIPITOR) 40 MG tablet Take 40 mg by mouth at bedtime.     Calcium Citrate-Vitamin D (CALCIUM CITRATE MAXIMUM/VIT D PO) Take by mouth.     empagliflozin (JARDIANCE) 25 MG TABS tablet Take 25 mg by mouth.     folic acid (FOLVITE) 400 MCG tablet Take 400 mcg by mouth daily.     losartan-hydrochlorothiazide (HYZAAR) 100-25 MG tablet Take 1 tablet by mouth daily.     metFORMIN (GLUCOPHAGE) 500 MG tablet Take 1,000 mg by mouth daily with breakfast. Take two tablets twice daily     ondansetron (ZOFRAN-ODT) 4 MG disintegrating tablet Take 4 mg by mouth every 8 (eight) hours as needed for vomiting or nausea.     pantoprazole (PROTONIX) 40 MG tablet Take 40 mg by mouth daily.     potassium chloride  SA (KLOR-CON  M) 20 MEQ tablet Take 2 tablets (40 mEq total) by mouth 3 (three) times daily for 3 days. 18 tablet 0   Boswellia-Glucosamine-Vit D (OSTEO BI-FLEX ONE PER DAY PO) Take by mouth. On hold (Patient not taking: Reported on 08/03/2023)     gabapentin (NEURONTIN) 100 MG capsule Take 300 mg by mouth at bedtime as needed (pain). Take  300mg  at bedtime (Patient not taking: Reported on 08/03/2023)     methocarbamol (ROBAXIN) 500 MG tablet Take 500 mg by mouth every 6 (six) hours as needed for muscle spasms. (Patient not taking: Reported on 08/03/2023)     promethazine (PHENERGAN) 25 MG tablet Take 25 mg by mouth every 6 (six) hours as needed for nausea or vomiting. (Patient not taking: Reported on 08/03/2023)     No current facility-administered medications on file prior to visit.    Allergies:  Allergies  Allergen Reactions   Diclofenac Sodium     Whole body cramped     Vital Signs:  BP 120/82   Pulse 68   Ht 5' 5 (1.651 m)   Wt 226 lb (102.5 kg)   SpO2 100%   BMI 37.61 kg/m    Neurological Exam: MENTAL STATUS including  orientation to time, place, person, recent and remote memory, attention span and concentration is normal.  CRANIAL NERVES: No papilledema on funduscopic exam.  No visual field defects.  Pupils equal round and reactive to light.  Normal conjugate, extra-ocular eye movements in all directions of gaze.  No ptosis.  Face is symmetric.   MOTOR:  Motor strength is 5/5 in all extremities.    COORDINATION/GAIT:  Gait narrow based and stable.    Data: Labs 02/01/2015:  HbA1c 6.9 Labs 09/22/2014:  TSH 2.030, vitamin B12 589 Lab Results  Component Value Date   TSH 2.00 08/22/2020   Lab Results  Component Value Date   VITAMINB12 622 08/22/2020   CSF 05/28/2016: R37 W1  G114 P24  OP 21cmH20  MRI brain 05/19/2016: Prominent optic nerve sheaths, possible flattening of the optic papillae, could represent a manifestation of idiopathic intracranial hypertension. Correlate clinically as a cause of headaches. Lumbar puncture could be helpful in further evaluation. Otherwise unremarkable cranial MRI. No cause seen for LEFT hemisensory defect.   Thank you for allowing me to participate in patient's care.  If I can answer any additional questions, I would be pleased to do so.    Sincerely,    Jaevon Paras K.  Tobie, DO

## 2023-10-20 ENCOUNTER — Telehealth: Payer: Self-pay | Admitting: Neurology

## 2023-10-20 MED ORDER — ACETAZOLAMIDE 250 MG PO TABS: ORAL_TABLET | ORAL | 3 refills | Status: DC

## 2023-10-20 NOTE — Telephone Encounter (Signed)
 Pt. Lefted a message stating that she wants to request a 90 day supply of her prescription call: acetaZOLAMIDE  (DIAMOX ) 250 MG tablet and it need to be sent to [facility] at [address] [place], [place] [address]. Thanks

## 2023-10-20 NOTE — Telephone Encounter (Signed)
 Rx sent.

## 2024-02-07 ENCOUNTER — Ambulatory Visit: Admitting: Neurology

## 2024-02-09 ENCOUNTER — Telehealth: Payer: Self-pay | Admitting: Neurology

## 2024-02-09 NOTE — Telephone Encounter (Signed)
 Pt called in this afternoon. Pt needs a refill on the prescription called: ACETOZOLAMIDE. Please send this prescription to : [facility]) Online. Thanks

## 2024-02-10 MED ORDER — ACETAZOLAMIDE 250 MG PO TABS: ORAL_TABLET | ORAL | 0 refills | Status: AC

## 2024-03-08 ENCOUNTER — Ambulatory Visit: Admitting: Neurology
